# Patient Record
Sex: Female | Born: 1975 | Race: Asian | Hispanic: No | Marital: Married | Smoking: Never smoker
Health system: Southern US, Community
[De-identification: ages and names within clinical notes are randomized; demographics above are authoritative.]

## PROBLEM LIST (undated history)

## (undated) DIAGNOSIS — N83209 Unspecified ovarian cyst, unspecified side: Secondary | ICD-10-CM

## (undated) DIAGNOSIS — R51 Headache: Secondary | ICD-10-CM

## (undated) DIAGNOSIS — F329 Major depressive disorder, single episode, unspecified: Secondary | ICD-10-CM

## (undated) DIAGNOSIS — F32A Depression, unspecified: Secondary | ICD-10-CM

## (undated) DIAGNOSIS — K5900 Constipation, unspecified: Secondary | ICD-10-CM

## (undated) DIAGNOSIS — R87619 Unspecified abnormal cytological findings in specimens from cervix uteri: Secondary | ICD-10-CM

## (undated) DIAGNOSIS — F419 Anxiety disorder, unspecified: Secondary | ICD-10-CM

## (undated) DIAGNOSIS — R519 Headache, unspecified: Secondary | ICD-10-CM

## (undated) DIAGNOSIS — K219 Gastro-esophageal reflux disease without esophagitis: Secondary | ICD-10-CM

## (undated) DIAGNOSIS — T7840XA Allergy, unspecified, initial encounter: Secondary | ICD-10-CM

## (undated) HISTORY — DX: Unspecified ovarian cyst, unspecified side: N83.209

## (undated) HISTORY — PX: WISDOM TOOTH EXTRACTION: SHX21

## (undated) HISTORY — PX: SHOULDER SURGERY: SHX246

## (undated) HISTORY — DX: Unspecified abnormal cytological findings in specimens from cervix uteri: R87.619

## (undated) HISTORY — DX: Allergy, unspecified, initial encounter: T78.40XA

## (undated) HISTORY — PX: COLPOSCOPY: SHX161

---

## 2014-02-15 ENCOUNTER — Encounter: Payer: Self-pay | Admitting: Obstetrics & Gynecology

## 2014-02-15 ENCOUNTER — Ambulatory Visit (INDEPENDENT_AMBULATORY_CARE_PROVIDER_SITE_OTHER): Payer: BC Managed Care – PPO | Admitting: Obstetrics & Gynecology

## 2014-02-15 VITALS — BP 145/96 | HR 64 | Resp 16 | Ht 65.0 in | Wt 128.0 lb

## 2014-02-15 DIAGNOSIS — Z1151 Encounter for screening for human papillomavirus (HPV): Secondary | ICD-10-CM

## 2014-02-15 DIAGNOSIS — Z01419 Encounter for gynecological examination (general) (routine) without abnormal findings: Secondary | ICD-10-CM | POA: Diagnosis not present

## 2014-02-15 DIAGNOSIS — Z124 Encounter for screening for malignant neoplasm of cervix: Secondary | ICD-10-CM | POA: Diagnosis not present

## 2014-02-15 DIAGNOSIS — N8320 Unspecified ovarian cysts: Secondary | ICD-10-CM

## 2014-02-15 DIAGNOSIS — Z30011 Encounter for initial prescription of contraceptive pills: Secondary | ICD-10-CM | POA: Diagnosis not present

## 2014-02-15 DIAGNOSIS — N83202 Unspecified ovarian cyst, left side: Secondary | ICD-10-CM | POA: Insufficient documentation

## 2014-02-15 MED ORDER — NORETHIN-ETH ESTRAD-FE BIPHAS 1 MG-10 MCG / 10 MCG PO TABS: 1.0000 | ORAL_TABLET | Freq: Every day | ORAL | Status: DC

## 2014-02-15 NOTE — Progress Notes (Signed)
Patient ID: Janet Gibson, female   DOB: [birth year], 38 y.o.   MRN: [id number]  Chief Complaint  Patient presents with  . Gynecologic Exam   HPI Janet Gibson is a 38 y.o. female.  G0P0000 Patient's last menstrual period was 02/02/2014 (approximate). On progestin only pill because her mother had a stroke age 20-70, has no clotting d/o HPI  Past Medical History  Diagnosis Date  . Abnormal Pap smear of cervix   . Ovarian cyst     Past Surgical History  Procedure Laterality Date  . Colposcopy     LEEP 2006 Family History  Problem Relation Age of Onset  . Heart disease Mother   . Heart disease Father   . Hypertension Mother   . Colon cancer Paternal Grandfather     Social History History  Substance Use Topics  . Smoking status: Never Smoker   . Smokeless tobacco: Never Used  . Alcohol Use: 1.8 oz/week    3 Not specified per week    No Known Allergies  Current Outpatient Prescriptions  Medication Sig Dispense Refill  . norethindrone (MICRONOR,CAMILA,ERRIN) 0.35 MG tablet      No current facility-administered medications for this visit.    Review of Systems Review of Systems  Constitutional: Negative.   Respiratory: Negative.   Gastrointestinal: Positive for constipation.  Genitourinary: Positive for menstrual problem (breakthrough bleeding) and pelvic pain (occasional and mild). Negative for vaginal bleeding and vaginal discharge.    Blood pressure 145/96, pulse 64, resp. rate 16, height 5' 5"  (1.651 m), weight 128 lb (58.06 kg), last menstrual period 02/02/2014.  Physical Exam Physical Exam  Constitutional: She is oriented to person, place, and time. She appears well-developed and well-nourished. No distress.  HENT:  Head: Normocephalic and atraumatic.  Neck: Normal range of motion. No thyromegaly present.  Cardiovascular: Normal rate, regular rhythm and normal heart sounds.   Pulmonary/Chest: Effort normal and breath sounds normal. No respiratory  distress.  Abdominal: Soft. She exhibits no distension. There is no tenderness.  Genitourinary: Vagina normal and uterus normal. No vaginal discharge found.  Pelvic exam: normal external genitalia, vulva, vagina, cervix, uterus and adnexa.   Neurological: She is alert and oriented to person, place, and time.  Skin: Skin is warm and dry.  Psychiatric: She has a normal mood and affect. Her behavior is normal.    Data Reviewed [place] report  x2 from [facility], 3.2 cm left ovarian complex cyst, last done in July Assessment    Left ovarian cyst for f/u scan . BTB on POP   Plan    F/u pap. LoLo estrin, repeat TV US        Janet Gibson 02/15/2014, 9:39 AM

## 2014-02-15 NOTE — Patient Instructions (Signed)
Abnormal Pap Test Information During a Pap test, the cells on the surface of your cervix are checked to see if they look normal, abnormal, or if they show signs of having been altered by a certain type of virus called human papillomavirus, or HPV. Cervical cells that have been affected by HPV are called dysplasia. Dysplasia is not cancer, but describes abnormal cells found on the surface of the cervix. Depending on the degree of dysplasia, some of the cells may be considered pre-cancerous and may turn into cancer over time if follow up with a caregiver is delayed.  WHAT DOES AN ABNORMAL PAP TEST MEAN? Having an abnormal pap test does not mean that you have cancer. However, certain types of abnormal pap tests can be a sign that a person is at a higher risk of developing cancer. Your caregiver will want to do other tests to find out more about the abnormal cells. Your abnormal Pap test results could show:   Small and uncertain changes that should be carefully watched.   Cervical dysplasia that has caused mild changes and can be followed over time.  Cervical dysplasia that is more severe and needs to be followed and treated to ensure the problem goes away.  Cancer.  When severe cervical dysplasia is found and treated early, it rarely will grow into cancer.  WHAT WILL BE DONE ABOUT MY ABNORMAL PAP TEST?  A colposcopy may be needed. This is a procedure where your cervix is examined using light and magnification.  A small tissue sample of your cervix (biopsy) may need to be removed and then examined. This is often performed if there are areas that appear infected.  A sample of cells from the cervical canal may be removed with either a small brush or scraping instrument (curette). Based on the results of the procedures above, some caregivers may recommend either cryotherapy of the cervix or a surgical LEEP where a portion of the cervix is removed. LEEP is short for "loop electrical excisional  procedure." Rarely, a caregiver may recommend a cone biopsy.This is a procedure where a small, cone-shaped sample of your cervix is taken out. The part that is taken out is the area where the abnormal cells are.  WHAT IF I HAVE A DYSPLASIA OR A CANCER? You may be referred to a specialist. Radiation may also be a treatment for more advanced cancer. Having a hysterectomy is the last treatment option for dysplasia, but it is a more common treatment for someone with cancer. All treatment options will be discussed with you by your caregiver. WHAT SHOULD YOU DO AFTER BEING TREATED? If you have had an abnormal pap test, you should continue to have regular pap tests and check-ups as directed by your caregiver. Your cervical problem will be carefully watched so it does not get worse. Also, your caregiver can watch for, and treat, any new problems that may come up. Document Released: 07/18/2010 Document Revised: 07/28/2012 Document Reviewed: 03/29/2011 [facility] Patient Information 2015 [place], [place]. This information is not intended to replace advice given to you by your health care provider. Make sure you discuss any questions you have with your health care provider.

## 2014-02-16 ENCOUNTER — Encounter: Payer: Self-pay | Admitting: *Deleted

## 2014-02-17 LAB — CYTOLOGY - PAP

## 2014-02-23 ENCOUNTER — Ambulatory Visit (HOSPITAL_COMMUNITY)
Admission: RE | Admit: 2014-02-23 | Discharge: 2014-02-23 | Disposition: A | Discharge status: Home / Self Care | Payer: BC Managed Care – PPO | Source: Ambulatory Visit | Attending: Obstetrics & Gynecology | Admitting: Obstetrics & Gynecology

## 2014-02-23 DIAGNOSIS — N949 Unspecified condition associated with female genital organs and menstrual cycle: Secondary | ICD-10-CM | POA: Insufficient documentation

## 2014-02-23 DIAGNOSIS — Q505 Embryonic cyst of broad ligament: Secondary | ICD-10-CM | POA: Insufficient documentation

## 2014-02-23 DIAGNOSIS — N9489 Other specified conditions associated with female genital organs and menstrual cycle: Secondary | ICD-10-CM | POA: Diagnosis not present

## 2014-02-23 DIAGNOSIS — D252 Subserosal leiomyoma of uterus: Secondary | ICD-10-CM | POA: Diagnosis not present

## 2014-02-23 DIAGNOSIS — N83202 Unspecified ovarian cyst, left side: Secondary | ICD-10-CM

## 2014-02-23 DIAGNOSIS — Z01419 Encounter for gynecological examination (general) (routine) without abnormal findings: Secondary | ICD-10-CM

## 2014-02-23 DIAGNOSIS — D250 Submucous leiomyoma of uterus: Secondary | ICD-10-CM | POA: Diagnosis not present

## 2014-03-15 ENCOUNTER — Encounter: Payer: Self-pay | Admitting: Obstetrics & Gynecology

## 2014-03-15 ENCOUNTER — Ambulatory Visit (INDEPENDENT_AMBULATORY_CARE_PROVIDER_SITE_OTHER): Payer: BC Managed Care – PPO | Admitting: Obstetrics & Gynecology

## 2014-03-15 VITALS — BP 145/91 | HR 62 | Resp 16 | Ht 65.0 in | Wt 128.0 lb

## 2014-03-15 DIAGNOSIS — R1909 Other intra-abdominal and pelvic swelling, mass and lump: Secondary | ICD-10-CM

## 2014-03-15 DIAGNOSIS — N83202 Unspecified ovarian cyst, left side: Secondary | ICD-10-CM

## 2014-03-15 NOTE — Progress Notes (Signed)
Subjective:     Patient ID: Janet Gibson, female   DOB: [birth year], 38 y.o.   MRN: [id number]  HPI hasn't started new Rx OCP waiting for menses. Occasional LLQ pain esp with defecation   Review of Systems  Constitutional: Negative.   Gastrointestinal: Positive for abdominal pain.  Genitourinary: Positive for pelvic pain. Negative for vaginal bleeding and vaginal discharge.       Objective:   Physical Exam  Constitutional: She is oriented to person, place, and time. She appears well-developed. No distress.  Neurological: She is alert and oriented to person, place, and time.  Skin: Skin is warm and dry.  Psychiatric: She has a normal mood and affect.        CLINICAL DATA: Intermittent pelvic pain, left greater than right. Abnormal uterine bleeding.  EXAM: TRANSABDOMINAL AND TRANSVAGINAL ULTRASOUND OF PELVIS  TECHNIQUE: Both transabdominal and transvaginal ultrasound examinations of the pelvis were performed. Transabdominal technique was performed for global imaging of the pelvis including uterus, ovaries, adnexal regions, and pelvic cul-de-sac. It was necessary to proceed with endovaginal exam following the transabdominal exam to visualize the endometrium and left ovary.  COMPARISON: None  FINDINGS: Uterus  Measurements: 9.2 x 4.0 x 4.4 cm. Multiple uterine fibroids, including:  --1.5 x 1.5 x 1.2 cm subserosal anterior fundal fibroid  --1.2 x 1.0 x 0.8 cm submucosal posterior uterine body fibroid  --0.8 x 0.7 x 0.6 cm submucosal left uterine body fibroid  Endometrium  Thickness: 4 mm. No focal abnormality visualized.  Right ovary  Measurements: 3.1 x 1.8 x 1.3 cm. 1.7 x 1.0 x 1.1 cm simple paraovarian cyst/follicle.  Left ovary  Measurements: 6.7 x 3.3 x 3.5 cm. 5.0 x 2.9 x 2.5 cm complex cystic adnexal mass, possibly reflecting complex hemorrhagic cyst or benign ovarian dermoid. Adjacent dilated tubular structure,  possibly reflecting a hydrosalpinx.  Other findings  Moderate pelvic fluid.  IMPRESSION: 5.0 cm complex left adnexal mass, possibly reflecting a hemorrhagic cyst or benign ovarian dermoid. Consider follow-up pelvic ultrasound in 6-12 weeks for initial evaluation.  Uterine fibroids, measuring up to 1.5 cm, as above.   Electronically Signed  By: Julian Hy M.D.  On: 02/23/2014 15:16    Assessment:     Persistent left complex adnexal/ovarian cyst complex. LLQ pain may be GI or Gyn origin, mild     Plan:     Discussed expectant management of this likely benign stable adnexal process vs surgical exploration. She will consider options and return to discuss further in a few weeks  [name], MD 03/15/2014

## 2014-03-15 NOTE — Patient Instructions (Signed)
Ovarian Cyst An ovarian cyst is a fluid-filled sac that forms on an ovary. The ovaries are small organs that produce eggs in women. Various types of cysts can form on the ovaries. Most are not cancerous. Many do not cause problems, and they often go away on their own. Some may cause symptoms and require treatment. Common types of ovarian cysts include:  Functional cysts--These cysts may occur every month during the menstrual cycle. This is normal. The cysts usually go away with the next menstrual cycle if the woman does not get pregnant. Usually, there are no symptoms with a functional cyst.  Endometrioma cysts--These cysts form from the tissue that lines the uterus. They are also called "chocolate cysts" because they become filled with blood that turns brown. This type of cyst can cause pain in the lower abdomen during intercourse and with your menstrual period.  Cystadenoma cysts--This type develops from the cells on the outside of the ovary. These cysts can get very big and cause lower abdomen pain and pain with intercourse. This type of cyst can twist on itself, cut off its blood supply, and cause severe pain. It can also easily rupture and cause a lot of pain.  Dermoid cysts--This type of cyst is sometimes found in both ovaries. These cysts may contain different kinds of body tissue, such as skin, teeth, hair, or cartilage. They usually do not cause symptoms unless they get very big.  Theca lutein cysts--These cysts occur when too much of a certain hormone (human chorionic gonadotropin) is produced and overstimulates the ovaries to produce an egg. This is most common after procedures used to assist with the conception of a baby (in vitro fertilization). CAUSES   Fertility drugs can cause a condition in which multiple large cysts are formed on the ovaries. This is called ovarian hyperstimulation syndrome.  A condition called polycystic ovary syndrome can cause hormonal imbalances that can lead to  nonfunctional ovarian cysts. SIGNS AND SYMPTOMS  Many ovarian cysts do not cause symptoms. If symptoms are present, they may include:  Pelvic pain or pressure.  Pain in the lower abdomen.  Pain during sexual intercourse.  Increasing girth (swelling) of the abdomen.  Abnormal menstrual periods.  Increasing pain with menstrual periods.  Stopping having menstrual periods without being pregnant. DIAGNOSIS  These cysts are commonly found during a routine or annual pelvic exam. Tests may be ordered to find out more about the cyst. These tests may include:  Ultrasound.  X-ray of the pelvis.  CT scan.  MRI.  Blood tests. TREATMENT  Many ovarian cysts go away on their own without treatment. Your health care provider may want to check your cyst regularly for 2-3 months to see if it changes. For women in menopause, it is particularly important to monitor a cyst closely because of the higher rate of ovarian cancer in menopausal women. When treatment is needed, it may include any of the following:  A procedure to drain the cyst (aspiration). This may be done using a long needle and ultrasound. It can also be done through a laparoscopic procedure. This involves using a thin, lighted tube with a tiny camera on the end (laparoscope) inserted through a small incision.  Surgery to remove the whole cyst. This may be done using laparoscopic surgery or an open surgery involving a larger incision in the lower abdomen.  Hormone treatment or birth control pills. These methods are sometimes used to help dissolve a cyst. HOME CARE INSTRUCTIONS   Only take over-the-counter  or prescription medicines as directed by your health care provider.  Follow up with your health care provider as directed.  Get regular pelvic exams and Pap tests. SEEK MEDICAL CARE IF:   Your periods are late, irregular, or painful, or they stop.  Your pelvic pain or abdominal pain does not go away.  Your abdomen becomes  larger or swollen.  You have pressure on your bladder or trouble emptying your bladder completely.  You have pain during sexual intercourse.  You have feelings of fullness, pressure, or discomfort in your stomach.  You lose weight for no apparent reason.  You feel generally ill.  You become constipated.  You lose your appetite.  You develop acne.  You have an increase in body and facial hair.  You are gaining weight, without changing your exercise and eating habits.  You think you are pregnant. SEEK IMMEDIATE MEDICAL CARE IF:   You have increasing abdominal pain.  You feel sick to your stomach (nauseous), and you throw up (vomit).  You develop a fever that comes on suddenly.  You have abdominal pain during a bowel movement.  Your menstrual periods become heavier than usual. MAKE SURE YOU:  Understand these instructions.  Will watch your condition.  Will get help right away if you are not doing well or get worse. Document Released: 04/02/2005 Document Revised: 04/07/2013 Document Reviewed: 12/08/2012 [facility] Patient Information 2015 [place], [place]. This information is not intended to replace advice given to you by your health care provider. Make sure you discuss any questions you have with your health care provider.

## 2014-05-03 ENCOUNTER — Encounter: Payer: Self-pay | Admitting: Obstetrics & Gynecology

## 2014-05-03 ENCOUNTER — Ambulatory Visit (INDEPENDENT_AMBULATORY_CARE_PROVIDER_SITE_OTHER): Payer: BLUE CROSS/BLUE SHIELD | Admitting: Obstetrics & Gynecology

## 2014-05-03 VITALS — BP 137/92 | HR 71 | Resp 16 | Ht 65.0 in | Wt 131.0 lb

## 2014-05-03 DIAGNOSIS — N83202 Unspecified ovarian cyst, left side: Secondary | ICD-10-CM

## 2014-05-03 DIAGNOSIS — N8320 Unspecified ovarian cysts: Secondary | ICD-10-CM

## 2014-05-03 NOTE — Progress Notes (Signed)
Patient ID: Janet Gibson, female   DOB: [birth year], 39 y.o.   MRN: [id number]  Chief Complaint  Patient presents with  . Follow-up    Ovarian cyst    HPI Janet Gibson is a 39 y.o. female.  Still notes occasional LLQ pain with BM and with menses, sharp. No dysuria. Just began to take her OCP consistently.Patient's last menstrual period was 04/25/2014 (approximate). G0P0000   HPI  Past Medical History  Diagnosis Date  . Abnormal Pap smear of cervix   . Ovarian cyst     Past Surgical History  Procedure Laterality Date  . Colposcopy      Family History  Problem Relation Age of Onset  . Heart disease Mother   . Heart disease Father   . Hypertension Mother   . Colon cancer Paternal Grandfather     Social History History  Substance Use Topics  . Smoking status: Never Smoker   . Smokeless tobacco: Never Used  . Alcohol Use: 1.8 oz/week    3 Not specified per week    No Known Allergies  Current Outpatient Prescriptions  Medication Sig Dispense Refill  . Norethindrone-Ethinyl Estradiol-Fe Biphas (LO LOESTRIN FE) 1 MG-10 MCG / 10 MCG tablet Take 1 tablet by mouth daily. 1 Package 11   No current facility-administered medications for this visit.    Review of Systems Review of Systems  Constitutional: Negative.   Gastrointestinal: Positive for abdominal pain. Negative for nausea, constipation and blood in stool.  Genitourinary: Positive for pelvic pain. Negative for dysuria, vaginal bleeding, vaginal discharge and menstrual problem.    Blood pressure 137/92, pulse 71, resp. rate 16, height 5' 5"  (1.651 m), weight 59.421 kg (131 lb), last menstrual period 04/25/2014.  Physical Exam Physical Exam  Constitutional: She is oriented to person, place, and time. She appears well-developed. No distress.  Pulmonary/Chest: Effort normal. No respiratory distress.  Neurological: She is alert and oriented to person, place, and time.  Psychiatric: She has a normal mood  and affect. Her behavior is normal.    Data Reviewed CLINICAL DATA: Intermittent pelvic pain, left greater than right. Abnormal uterine bleeding.  EXAM: TRANSABDOMINAL AND TRANSVAGINAL ULTRASOUND OF PELVIS  TECHNIQUE: Both transabdominal and transvaginal ultrasound examinations of the pelvis were performed. Transabdominal technique was performed for global imaging of the pelvis including uterus, ovaries, adnexal regions, and pelvic cul-de-sac. It was necessary to proceed with endovaginal exam following the transabdominal exam to visualize the endometrium and left ovary.  COMPARISON: None  FINDINGS: Uterus  Measurements: 9.2 x 4.0 x 4.4 cm. Multiple uterine fibroids, including:  --1.5 x 1.5 x 1.2 cm subserosal anterior fundal fibroid  --1.2 x 1.0 x 0.8 cm submucosal posterior uterine body fibroid  --0.8 x 0.7 x 0.6 cm submucosal left uterine body fibroid  Endometrium  Thickness: 4 mm. No focal abnormality visualized.  Right ovary  Measurements: 3.1 x 1.8 x 1.3 cm. 1.7 x 1.0 x 1.1 cm simple paraovarian cyst/follicle.  Left ovary  Measurements: 6.7 x 3.3 x 3.5 cm. 5.0 x 2.9 x 2.5 cm complex cystic adnexal mass, possibly reflecting complex hemorrhagic cyst or benign ovarian dermoid. Adjacent dilated tubular structure, possibly reflecting a hydrosalpinx.  Other findings  Moderate pelvic fluid.  IMPRESSION: 5.0 cm complex left adnexal mass, possibly reflecting a hemorrhagic cyst or benign ovarian dermoid. Consider follow-up pelvic ultrasound in 6-12 weeks for initial evaluation.  Uterine fibroids, measuring up to 1.5 cm, as above.   Electronically Signed  By: Janet Gibson M.D.  On: 02/23/2014  15:16        Assessment    Pelvic pain left ovarian cyst and possible hydrosalpinx     Plan    Requests diagnostic laparoscopy which I will schedule as OP. The procedure and risks were discussed including continued pain and  failure to diagnose her condition.  [name], MD 05/03/2014         Eliyohu Class 05/03/2014, 1:33 PM

## 2014-05-03 NOTE — Patient Instructions (Signed)

## 2014-05-05 ENCOUNTER — Encounter (HOSPITAL_COMMUNITY): Payer: Self-pay

## 2014-05-06 ENCOUNTER — Ambulatory Visit (HOSPITAL_COMMUNITY)
Admission: RE | Admit: 2014-05-06 | Discharge: 2014-05-06 | Disposition: A | Discharge status: Home / Self Care | Payer: BLUE CROSS/BLUE SHIELD | Source: Ambulatory Visit | Attending: Obstetrics & Gynecology | Admitting: Obstetrics & Gynecology

## 2014-05-06 DIAGNOSIS — N8320 Unspecified ovarian cysts: Secondary | ICD-10-CM | POA: Insufficient documentation

## 2014-05-06 DIAGNOSIS — N83202 Unspecified ovarian cyst, left side: Secondary | ICD-10-CM

## 2014-05-10 ENCOUNTER — Other Ambulatory Visit: Payer: Self-pay | Admitting: Obstetrics & Gynecology

## 2014-05-10 ENCOUNTER — Telehealth: Payer: Self-pay | Admitting: *Deleted

## 2014-05-10 NOTE — Telephone Encounter (Signed)
-----   Message from [name], MD sent at 05/06/2014  8:07 PM EST ----- Let ovarian cyst greatly reduced in size

## 2014-05-10 NOTE — Telephone Encounter (Signed)
Called pt to adv ovarian cyst reduced in size - LMOM for pt to rtn if needed.

## 2014-05-12 ENCOUNTER — Encounter (HOSPITAL_COMMUNITY)
Admission: RE | Disposition: A | Discharge status: Home / Self Care | Payer: Self-pay | Source: Ambulatory Visit | Attending: Obstetrics & Gynecology

## 2014-05-12 ENCOUNTER — Ambulatory Visit (HOSPITAL_COMMUNITY): Payer: BLUE CROSS/BLUE SHIELD | Admitting: Anesthesiology

## 2014-05-12 ENCOUNTER — Encounter (HOSPITAL_COMMUNITY): Payer: Self-pay | Admitting: *Deleted

## 2014-05-12 ENCOUNTER — Ambulatory Visit (HOSPITAL_COMMUNITY)
Admission: RE | Admit: 2014-05-12 | Discharge: 2014-05-12 | Disposition: A | Discharge status: Home / Self Care | Payer: BLUE CROSS/BLUE SHIELD | Source: Ambulatory Visit | Attending: Obstetrics & Gynecology | Admitting: Obstetrics & Gynecology

## 2014-05-12 DIAGNOSIS — Z8659 Personal history of other mental and behavioral disorders: Secondary | ICD-10-CM | POA: Diagnosis not present

## 2014-05-12 DIAGNOSIS — Z8742 Personal history of other diseases of the female genital tract: Secondary | ICD-10-CM | POA: Diagnosis not present

## 2014-05-12 DIAGNOSIS — N736 Female pelvic peritoneal adhesions (postinfective): Secondary | ICD-10-CM | POA: Diagnosis not present

## 2014-05-12 DIAGNOSIS — Z8719 Personal history of other diseases of the digestive system: Secondary | ICD-10-CM | POA: Insufficient documentation

## 2014-05-12 DIAGNOSIS — Z79899 Other long term (current) drug therapy: Secondary | ICD-10-CM | POA: Diagnosis not present

## 2014-05-12 DIAGNOSIS — R339 Retention of urine, unspecified: Secondary | ICD-10-CM | POA: Diagnosis not present

## 2014-05-12 DIAGNOSIS — N838 Other noninflammatory disorders of ovary, fallopian tube and broad ligament: Secondary | ICD-10-CM | POA: Diagnosis not present

## 2014-05-12 DIAGNOSIS — D259 Leiomyoma of uterus, unspecified: Secondary | ICD-10-CM | POA: Diagnosis not present

## 2014-05-12 HISTORY — DX: Headache: R51

## 2014-05-12 HISTORY — DX: Major depressive disorder, single episode, unspecified: F32.9

## 2014-05-12 HISTORY — PX: LAPAROSCOPY: SHX197

## 2014-05-12 HISTORY — DX: Headache, unspecified: R51.9

## 2014-05-12 HISTORY — DX: Gastro-esophageal reflux disease without esophagitis: K21.9

## 2014-05-12 HISTORY — DX: Anxiety disorder, unspecified: F41.9

## 2014-05-12 HISTORY — DX: Constipation, unspecified: K59.00

## 2014-05-12 HISTORY — DX: Depression, unspecified: F32.A

## 2014-05-12 LAB — CBC
HEMATOCRIT: 40.8 % (ref 36.0–46.0)
HEMOGLOBIN: 13.7 g/dL (ref 12.0–15.0)
MCH: 32.2 pg (ref 26.0–34.0)
MCHC: 33.6 g/dL (ref 30.0–36.0)
MCV: 95.8 fL (ref 78.0–100.0)
Platelets: 258 10*3/uL (ref 150–400)
RBC: 4.26 MIL/uL (ref 3.87–5.11)
RDW: 12.3 % (ref 11.5–15.5)
WBC: 5.2 10*3/uL (ref 4.0–10.5)

## 2014-05-12 LAB — PREGNANCY, URINE: Preg Test, Ur: NEGATIVE

## 2014-05-12 SURGERY — LAPAROSCOPY, DIAGNOSTIC
Anesthesia: General | Site: Abdomen

## 2014-05-12 MED ORDER — MEPERIDINE HCL 25 MG/ML IJ SOLN: 6.2500 mg | INTRAMUSCULAR | Status: DC | PRN

## 2014-05-12 MED ORDER — SCOPOLAMINE 1 MG/3DAYS TD PT72
1.0000 | MEDICATED_PATCH | Freq: Once | TRANSDERMAL | Status: DC
  Administered 2014-05-12: 1.5 mg via TRANSDERMAL

## 2014-05-12 MED ORDER — DEXAMETHASONE SODIUM PHOSPHATE 10 MG/ML IJ SOLN
INTRAMUSCULAR | Status: AC
  Filled 2014-05-12: qty 1

## 2014-05-12 MED ORDER — NEOSTIGMINE METHYLSULFATE 10 MG/10ML IV SOLN
INTRAVENOUS | Status: AC
  Filled 2014-05-12: qty 1

## 2014-05-12 MED ORDER — PROPOFOL 10 MG/ML IV BOLUS
INTRAVENOUS | Status: AC
  Filled 2014-05-12: qty 20

## 2014-05-12 MED ORDER — DEXAMETHASONE SODIUM PHOSPHATE 10 MG/ML IJ SOLN
INTRAMUSCULAR | Status: DC | PRN
  Administered 2014-05-12: 4 mg via INTRAVENOUS

## 2014-05-12 MED ORDER — METHYLENE BLUE 1 % INJ SOLN
INTRAMUSCULAR | Status: AC
  Filled 2014-05-12: qty 10

## 2014-05-12 MED ORDER — LACTATED RINGERS IV SOLN: INTRAVENOUS | Status: DC

## 2014-05-12 MED ORDER — PROPOFOL 10 MG/ML IV BOLUS
INTRAVENOUS | Status: DC | PRN
  Administered 2014-05-12: 160 mg via INTRAVENOUS

## 2014-05-12 MED ORDER — ACETAMINOPHEN 160 MG/5ML PO SOLN: 325.0000 mg | ORAL | Status: DC | PRN

## 2014-05-12 MED ORDER — PROMETHAZINE HCL 25 MG/ML IJ SOLN: 6.2500 mg | INTRAMUSCULAR | Status: DC | PRN

## 2014-05-12 MED ORDER — LIDOCAINE HCL (CARDIAC) 20 MG/ML IV SOLN
INTRAVENOUS | Status: AC
  Filled 2014-05-12: qty 5

## 2014-05-12 MED ORDER — ONDANSETRON HCL 4 MG/2ML IJ SOLN
INTRAMUSCULAR | Status: AC
  Filled 2014-05-12: qty 2

## 2014-05-12 MED ORDER — NEOSTIGMINE METHYLSULFATE 10 MG/10ML IV SOLN
INTRAVENOUS | Status: DC | PRN
  Administered 2014-05-12: 2 mg via INTRAVENOUS

## 2014-05-12 MED ORDER — KETOROLAC TROMETHAMINE 30 MG/ML IJ SOLN
INTRAMUSCULAR | Status: DC | PRN
  Administered 2014-05-12: 30 mg via INTRAVENOUS

## 2014-05-12 MED ORDER — MIDAZOLAM HCL 2 MG/2ML IJ SOLN
INTRAMUSCULAR | Status: AC
  Filled 2014-05-12: qty 2

## 2014-05-12 MED ORDER — KETOROLAC TROMETHAMINE 30 MG/ML IJ SOLN
INTRAMUSCULAR | Status: AC
  Filled 2014-05-12: qty 1

## 2014-05-12 MED ORDER — BUPIVACAINE HCL (PF) 0.25 % IJ SOLN
INTRAMUSCULAR | Status: AC
Start: 2014-05-12 — End: 2014-05-12
  Filled 2014-05-12: qty 30

## 2014-05-12 MED ORDER — FENTANYL CITRATE 0.05 MG/ML IJ SOLN
INTRAMUSCULAR | Status: DC | PRN
  Administered 2014-05-12 (×3): 50 ug via INTRAVENOUS
  Administered 2014-05-12: 100 ug via INTRAVENOUS

## 2014-05-12 MED ORDER — GLYCOPYRROLATE 0.2 MG/ML IJ SOLN
INTRAMUSCULAR | Status: AC
  Filled 2014-05-12: qty 4

## 2014-05-12 MED ORDER — ROCURONIUM BROMIDE 100 MG/10ML IV SOLN
INTRAVENOUS | Status: DC | PRN
  Administered 2014-05-12: 20 mg via INTRAVENOUS
  Administered 2014-05-12 (×2): 5 mg via INTRAVENOUS

## 2014-05-12 MED ORDER — MIDAZOLAM HCL 2 MG/2ML IJ SOLN: 0.5000 mg | Freq: Once | INTRAMUSCULAR | Status: DC | PRN

## 2014-05-12 MED ORDER — LACTATED RINGERS IR SOLN
Status: DC | PRN
  Administered 2014-05-12: 500 mL

## 2014-05-12 MED ORDER — GLYCOPYRROLATE 0.2 MG/ML IJ SOLN
INTRAMUSCULAR | Status: DC | PRN
  Administered 2014-05-12: 0.1 mg via INTRAVENOUS
  Administered 2014-05-12: 0.4 mg via INTRAVENOUS

## 2014-05-12 MED ORDER — ONDANSETRON HCL 4 MG/2ML IJ SOLN
INTRAMUSCULAR | Status: DC | PRN
  Administered 2014-05-12: 4 mg via INTRAVENOUS

## 2014-05-12 MED ORDER — HYDROMORPHONE HCL 1 MG/ML IJ SOLN: 0.2500 mg | INTRAMUSCULAR | Status: DC | PRN

## 2014-05-12 MED ORDER — FENTANYL CITRATE 0.05 MG/ML IJ SOLN
INTRAMUSCULAR | Status: AC
  Filled 2014-05-12: qty 5

## 2014-05-12 MED ORDER — MIDAZOLAM HCL 2 MG/2ML IJ SOLN
INTRAMUSCULAR | Status: DC | PRN
  Administered 2014-05-12: 2 mg via INTRAVENOUS

## 2014-05-12 MED ORDER — BUPIVACAINE HCL (PF) 0.25 % IJ SOLN
INTRAMUSCULAR | Status: DC | PRN
  Administered 2014-05-12: 14 mL

## 2014-05-12 MED ORDER — KETOROLAC TROMETHAMINE 30 MG/ML IJ SOLN: 30.0000 mg | Freq: Once | INTRAMUSCULAR | Status: DC | PRN

## 2014-05-12 MED ORDER — SCOPOLAMINE 1 MG/3DAYS TD PT72
MEDICATED_PATCH | TRANSDERMAL | Status: DC
Start: 2014-05-12 — End: 2014-05-12
  Filled 2014-05-12: qty 1

## 2014-05-12 MED ORDER — ACETAMINOPHEN 325 MG PO TABS: 325.0000 mg | ORAL_TABLET | ORAL | Status: DC | PRN

## 2014-05-12 MED ORDER — LIDOCAINE HCL (CARDIAC) 20 MG/ML IV SOLN
INTRAVENOUS | Status: DC | PRN
  Administered 2014-05-12: 60 mg via INTRAVENOUS

## 2014-05-12 MED ORDER — OXYCODONE-ACETAMINOPHEN 5-325 MG PO TABS: 1.0000 | ORAL_TABLET | Freq: Four times a day (QID) | ORAL | Status: DC | PRN

## 2014-05-12 MED ORDER — ROCURONIUM BROMIDE 100 MG/10ML IV SOLN
INTRAVENOUS | Status: AC
  Filled 2014-05-12: qty 1

## 2014-05-12 MED ORDER — LACTATED RINGERS IV SOLN
INTRAVENOUS | Status: DC
Start: 2014-05-12 — End: 2014-05-12
  Administered 2014-05-12 (×2): via INTRAVENOUS

## 2014-05-12 SURGICAL SUPPLY — 31 items
BLADE SURG 11 STRL SS (BLADE) ×3 IMPLANT
CABLE HIGH FREQUENCY MONO STRZ (ELECTRODE) ×3 IMPLANT
CATH ROBINSON RED A/P 16FR (CATHETERS) IMPLANT
CHLORAPREP W/TINT 26ML (MISCELLANEOUS) ×3 IMPLANT
CLOSURE WOUND 1/2 X4 (GAUZE/BANDAGES/DRESSINGS)
CLOTH BEACON ORANGE TIMEOUT ST (SAFETY) ×3 IMPLANT
DRSG COVADERM PLUS 2X2 (GAUZE/BANDAGES/DRESSINGS) ×3 IMPLANT
DRSG OPSITE POSTOP 3X4 (GAUZE/BANDAGES/DRESSINGS) ×3 IMPLANT
GLOVE BIO SURGEON STRL SZ 6.5 (GLOVE) ×2 IMPLANT
GLOVE BIO SURGEONS STRL SZ 6.5 (GLOVE) ×1
GLOVE BIOGEL PI IND STRL 7.0 (GLOVE) ×1 IMPLANT
GLOVE BIOGEL PI INDICATOR 7.0 (GLOVE) ×2
GOWN STRL REUS W/TWL LRG LVL3 (GOWN DISPOSABLE) ×6 IMPLANT
LIQUID BAND (GAUZE/BANDAGES/DRESSINGS) ×3 IMPLANT
NEEDLE INSUFFLATION 120MM (ENDOMECHANICALS) ×3 IMPLANT
NS IRRIG 1000ML POUR BTL (IV SOLUTION) ×3 IMPLANT
PACK LAPAROSCOPY BASIN (CUSTOM PROCEDURE TRAY) ×3 IMPLANT
PAD POSITIONER PINK NONSTERILE (MISCELLANEOUS) IMPLANT
PROTECTOR NERVE ULNAR (MISCELLANEOUS) ×3 IMPLANT
SCISSORS LAP 5X35 DISP (ENDOMECHANICALS) ×3 IMPLANT
SET IRRIG TUBING LAPAROSCOPIC (IRRIGATION / IRRIGATOR) IMPLANT
SHEARS HARMONIC ACE PLUS 36CM (ENDOMECHANICALS) IMPLANT
STRIP CLOSURE SKIN 1/2X4 (GAUZE/BANDAGES/DRESSINGS) IMPLANT
SUT VICRYL 0 UR6 27IN ABS (SUTURE) ×3 IMPLANT
SUT VICRYL 4-0 PS2 18IN ABS (SUTURE) ×3 IMPLANT
TOWEL OR 17X24 6PK STRL BLUE (TOWEL DISPOSABLE) ×6 IMPLANT
TRAY FOLEY BAG SILVER LF 16FR (CATHETERS) ×3 IMPLANT
TROCAR XCEL DIL TIP R 11M (ENDOMECHANICALS) ×3 IMPLANT
TROCAR XCEL NON-BLD 5MMX100MML (ENDOMECHANICALS) ×6 IMPLANT
WARMER LAPAROSCOPE (MISCELLANEOUS) ×3 IMPLANT
WATER STERILE IRR 1000ML POUR (IV SOLUTION) ×3 IMPLANT

## 2014-05-12 NOTE — Anesthesia Procedure Notes (Signed)
Procedure Name: Intubation Date/Time: 05/12/2014 1:12 PM Performed by: Flossie Dibble Pre-anesthesia Checklist: Suction available, Emergency Drugs available, Timeout performed, Patient being monitored and Patient identified Oxygen Delivery Method: Circle system utilized Preoxygenation: Pre-oxygenation with 100% oxygen Intubation Type: IV induction Ventilation: Mask ventilation without difficulty Laryngoscope Size: Mac and 3 Grade View: Grade I Tube size: 7.0 mm Number of attempts: 1 Placement Confirmation: ETT inserted through vocal cords under direct vision,  breath sounds checked- equal and bilateral and positive ETCO2 Secured at: 21 cm Tube secured with: Tape Dental Injury: Teeth and Oropharynx as per pre-operative assessment

## 2014-05-12 NOTE — Discharge Instructions (Signed)
Lysis of Adhesions Adhesions are internal scars that may lead to troubling side effects. They are bands of scar tissue that can occur anywhere in the body but are usually formed within the abdomen or pelvis. When adhesions are formed, they can cause problems in the normal function of the organs. This scar tissue can cause abnormal "sticking" together of two surfaces. This kind of scar can also "squeeze" an organ and cause a blockage. Adhesions can develop after surgery and may also form as a result of an inflammation or infection. Depending on where the adhesions are, they may cause:  Pain.  Obstruction of the bowel.  Infertility.  Other complications. Lysis (loosening or setting free) is a surgical procedure done to remove or loosen the scars that cause the problems described above. This can help to reduce or relieve pain and help to restore normal function.  LET YOUR CAREGIVER KNOW ABOUT:   Allergies.  Medicines taken including herbs, eye drops, prescription medications, over-the-counter medications, and creams.  Use of steroids (by mouth or creams).  Use of aspirin or blood-thinning medicines.  History of bleeding or blood problems.  Previous problems with anesthetics or Novocaine.  Possibility of pregnancy, if this applies.  History of blood clots (thrombophlebitis).  Previous surgery.  Previous/existing problems with the heart or lung.  Other health problems, such as diabetes. RISKS AND COMPLICATIONS  Injury to the bowel, bladder, blood vessels, and/or tubes that carry urine to the urinary bladder (ureters).  Bleeding.  Infection.  Recurrence of adhesions.  Abnormal protrusion of an organ through an operative incision (incisional hernia). BEFORE THE PROCEDURE  Your caregiver may:  Discuss the risks and complications of the procedure.  Recommend imaging studies and blood tests.  Advise you regarding medicines you are taking.  Discuss your anesthesia  requirements. PROCEDURE Lysis of adhesions is usually performed by keyhole surgery (laparoscopically). During this procedure, the surgeon will make a few tiny keyhole incisions. He/she will pass a tiny fiberoptic scope through one hole to view the adhesions on a monitor. Using small instruments that are passed through the other holes, the surgery will be performed to remove the adhesions and free the tissues/organs. The instruments are then removed, and the incisions are closed. This procedure may cause minimal scarring. Sometimes, open surgery (laparotomy) may be required where a single larger incision will be made to allow the surgeon a direct access to the area of the adhesions.  You may be given regional or general anesthesia. If the surgery is going to be performed under general anesthesia:  A light meal, such as soup or salad, is recommended on the previous night.  You will probably be asked not to eat or drink anything after midnight on the night before the procedure. AFTER THE PROCEDURE  The surgery may last for 1-3 hours. Depending on the type of surgery, you may have to spend a night or a few days in the hospital. Typically, patients stay in the hospital and can not eat or drink until bowel function returns. Patients may have a tube put in place to decrease problems with the bowels (nasogastric tube). When your system can tolerate food, you can go home. Your caregiver will give you certain medications to control your pain. HOME CARE INSTRUCTIONS   Follow all the instructions given by your surgeon at the time of discharge.  Take all prescribed medications as instructed and go for regular follow-ups.  Keep the dressing clean and dry.  As pain gets less, increase moving and  walking and other regular activities that you do every day.  Avoid lifting heavy objects unless advised otherwise by your caregiver.  At first, you may need to eat only a little at a time. As your appetite improves,  you can slowly go back to eating normally.  If applicable, your caregiver can help you to know when it is okay to start driving and exercising again. SEEK MEDICAL CARE IF:   You develop fever above 100 F (37.8 C).  You develop infection.  Your pain increases.  You notice redness or swelling at the site of the incision.  You have nausea and vomiting that does not go away after a few hours.  You have a cough.  You have bleeding or oozing from the incision site. SEEK IMMEDIATE MEDICAL CARE IF:   You develop fever of 102 F (38.9 C) or above.  You develop severe abdominal, chest pain.  You develop shortness of breath.  You experience nausea and/or vomiting that keeps coming back or is severe. Document Released: 06/29/2008 Document Revised: 06/25/2011 Document Reviewed: 06/29/2008 [facility] Patient Information 2015 [place], [place]. This information is not intended to replace advice given to you by your health care provider. Make sure you discuss any questions you have with your health care provider. DISCHARGE INSTRUCTIONS: Laparoscopy  The following instructions have been prepared to help you care for yourself upon your return home today.  Wound care:  Do not get the incision wet for the first 24 hours. The incision should be kept clean and dry.  The Band-Aids or dressings may be removed the day after surgery.  Should the incision become sore, red, and swollen after the first week, check with your doctor.  Personal hygiene:  Shower the day after your procedure.  Activity and limitations:  Do NOT drive or operate any equipment today.  Do NOT lift anything more than 15 pounds for 2-3 weeks after surgery.  Do NOT rest in bed all day.  Walking is encouraged. Walk each day, starting slowly with 5-minute walks 3 or 4 times a day. Slowly increase the length of your walks.  Walk up and down stairs slowly.  Do NOT do strenuous activities, such as golfing, playing tennis,  bowling, running, biking, weight lifting, gardening, mowing, or vacuuming for 2-4 weeks. Ask your doctor when it is okay to start.  Diet: Eat a light meal as desired this evening. You may resume your usual diet tomorrow.  Return to work: This is dependent on the type of work you do. For the most part you can return to a desk job within a week of surgery. If you are more active at work, please discuss this with your doctor.  What to expect after your surgery: You may have a slight burning sensation when you urinate on the first day. You may have a very small amount of blood in the urine. Expect to have a small amount of vaginal discharge/light bleeding for 1-2 weeks. It is not unusual to have abdominal soreness and bruising for up to 2 weeks. You may be tired and need more rest for about 1 week. You may experience shoulder pain for 24-72 hours. Lying flat in bed may relieve it.  Call your doctor for any of the following:  Develop a fever of 100.4 or greater  Inability to urinate 6 hours after discharge from hospital  Severe pain not relieved by pain medications  Persistent of heavy bleeding at incision site  Redness or swelling around incision site  after a week  Increasing nausea or vomiting  Patient Signature________________________________________ Nurse Signature_________________________________________ Post Anesthesia Home Care Instructions  Activity: Get plenty of rest for the remainder of the day. A responsible adult should stay with you for 24 hours following the procedure.  For the next 24 hours, DO NOT: -Drive a car -Paediatric nurse -Drink alcoholic beverages -Take any medication unless instructed by your physician -Make any legal decisions or sign important papers.  Meals: Start with liquid foods such as gelatin or soup. Progress to regular foods as tolerated. Avoid greasy, spicy, heavy foods. If nausea and/or vomiting occur, drink only clear liquids until the nausea  and/or vomiting subsides. Call your physician if vomiting continues.  Special Instructions/Symptoms: Your throat may feel dry or sore from the anesthesia or the breathing tube placed in your throat during surgery. If this causes discomfort, gargle with warm salt water. The discomfort should disappear within 24 hours.

## 2014-05-12 NOTE — Op Note (Addendum)
Diagnostic Laparoscopy Procedure Note Diagnostic laparoscopy, drainage of bilateral paratubal cysts, lysis of pelvic adhesions, chromopertubation Indications: The patient is a 39 y.o. female with pelvic pain and history of ovarian cysts and possible hydrosalpinx based on ultrasound findings.  Pre-operative Diagnosis: Pelvic pain and possible hydrosalpinx  Post-operative Diagnosis: Pelvic adhesions bilateral paratubal cysts  Surgeon: Makyia Erxleben   Assistants: NoneGeneral endotracheal anesthesia  Anesthesia: General endotracheal anesthesia  ASA Class: 1  Procedure Details  The patient was seen in the Holding Room. The risks, benefits, complications, treatment options, and expected outcomes were discussed with the patient. The possibilities of reaction to medication, pulmonary aspiration, perforation of viscus, bleeding, recurrent infection, the need for additional procedures, failure to diagnose a condition, and creating a complication requiring transfusion or operation were discussed with the patient. The patient concurred with the proposed plan, giving informed consent. The patient was taken to the Operating Room, identified as Janet Gibson and the procedure verified as Diagnostic Laparoscopy. A Time Out was held and the above information confirmed.  After induction of general anesthesia, the patient was placed in modified dorsal lithotomy position where she was prepped, draped, and catheterized in the normal, sterile fashion.  The cervix was visualized and an intrauterine manipulator was placed. A 1.5 cm umbilical incision was then performed. Veress needle was passed and pneumoperitoneum was established. The above findings were noted. The uterus appeared normal. Second punctures were placed in the left lower quadrant and right lower quadrant with 5 mm ports. The tubes were inspected. The left tube had a distal paratubal cyst measuring about 3 centimeters. The right tube had approximately  2 cm paratubal cyst. The ovaries appeared normal. Cul-de-sac was normal. There were adhesions of bowel in the right lower quadrant along the sidewall and going down to the adnexa on the right. Using scissors and cautery these adhesions were lysed. There was good hemostasis. The appendix was visualized and appeared normal. Using the scissors and cautery both paratubal cyst were drained. No specimen was obtained. There is no evidence of endometriosis. Using the catheter that had been placed as the uterine manipulator methylene dye solution was injected and spillage of dye was seen from both fimbria appeared normal both sides. All instruments were removed and CO2 was released from the abdomen.   The incision was closed with fascial closure using 0 Vicryl and subcuticular sutures of 4-0 Vicryl. Liquid band was applied to the incisions. The intrauterine manipulator was then removed.  Instrument, sponge, and needle counts were correct prior to abdominal closure and at the conclusion of the case.   Findings: The anterior cul-de-sac and round ligaments normal The uterus normal The adnexa bilateral paratubal cysts and right lower quadrant adhesions Cul-de-sac normal  Estimated Blood Loss:  Minimal         Drains: None         Total IV Fluids: 1500 mL         Specimens: None              Complications:  None; patient tolerated the procedure well.         Disposition: PACU - hemodynamically stable.         Condition: stable  [name], MD  05/12/2014 2:40 PM

## 2014-05-12 NOTE — H&P (Signed)
Expand All Collapse All   Patient ID: Janet Gibson, female DOB: [birth year], 39 y.o. MRN: [id number]  Chief Complaint  Patient presents with  . Follow-up    Ovarian cyst    HPI Janet Gibson is a 39 y.o. female. Still notes occasional LLQ pain with BM and with menses, sharp. No dysuria. Just began to take her OCP consistently.Patient's last menstrual period was 04/25/2014 (approximate). [id number]  HPI  Past Medical History  Diagnosis Date  . Abnormal Pap smear of cervix   . Ovarian cyst     Past Surgical History  Procedure Laterality Date  . Colposcopy      Family History  Problem Relation Age of Onset  . Heart disease Mother   . Heart disease Father   . Hypertension Mother   . Colon cancer Paternal Grandfather     Social History History  Substance Use Topics  . Smoking status: Never Smoker   . Smokeless tobacco: Never Used  . Alcohol Use: 1.8 oz/week    3 Not specified per week    No Known Allergies  Current Outpatient Prescriptions  Medication Sig Dispense Refill  . Norethindrone-Ethinyl Estradiol-Fe Biphas (LO LOESTRIN FE) 1 MG-10 MCG / 10 MCG tablet Take 1 tablet by mouth daily. 1 Package 11   No current facility-administered medications for this visit.    Review of Systems Review of Systems  Constitutional: Negative.  Gastrointestinal: Positive for abdominal pain. Negative for nausea, constipation and blood in stool.  Genitourinary: Positive for pelvic pain. Negative for dysuria, vaginal bleeding, vaginal discharge and menstrual problem.    Blood pressure 137/92, pulse 71, resp. rate 16, height 5' 5"  (1.651 m), weight 59.421 kg (131 lb), last menstrual period 04/25/2014.  Physical Exam Physical Exam  Constitutional: She is oriented to person, place, and time. She appears well-developed. No distress.  Pulmonary/Chest: Effort normal. No respiratory distress.   Neurological: She is alert and oriented to person, place, and time.  Psychiatric: She has a normal mood and affect. Her behavior is normal.    Data Reviewed CLINICAL DATA: Intermittent pelvic pain, left greater than right. Abnormal uterine bleeding.  EXAM: TRANSABDOMINAL AND TRANSVAGINAL ULTRASOUND OF PELVIS  TECHNIQUE: Both transabdominal and transvaginal ultrasound examinations of the pelvis were performed. Transabdominal technique was performed for global imaging of the pelvis including uterus, ovaries, adnexal regions, and pelvic cul-de-sac. It was necessary to proceed with endovaginal exam following the transabdominal exam to visualize the endometrium and left ovary.  COMPARISON: None  FINDINGS: Uterus  Measurements: 9.2 x 4.0 x 4.4 cm. Multiple uterine fibroids, including:  --1.5 x 1.5 x 1.2 cm subserosal anterior fundal fibroid  --1.2 x 1.0 x 0.8 cm submucosal posterior uterine body fibroid  --0.8 x 0.7 x 0.6 cm submucosal left uterine body fibroid  Endometrium  Thickness: 4 mm. No focal abnormality visualized.  Right ovary  Measurements: 3.1 x 1.8 x 1.3 cm. 1.7 x 1.0 x 1.1 cm simple paraovarian cyst/follicle.  Left ovary  Measurements: 6.7 x 3.3 x 3.5 cm. 5.0 x 2.9 x 2.5 cm complex cystic adnexal mass, possibly reflecting complex hemorrhagic cyst or benign ovarian dermoid. Adjacent dilated tubular structure, possibly reflecting a hydrosalpinx.  Other findings  Moderate pelvic fluid.  IMPRESSION: 5.0 cm complex left adnexal mass, possibly reflecting a hemorrhagic cyst or benign ovarian dermoid. Consider follow-up pelvic ultrasound in 6-12 weeks for initial evaluation.  Uterine fibroids, measuring up to 1.5 cm, as above.   Electronically Signed  By: Julian Hy M.D.  On: 02/23/2014 15:16  Assessment    Pelvic pain left ovarian cyst and possible hydrosalpinx   Left ovarian cyst has significantly  resolved since previous US  Plan    Requests diagnostic laparoscopy which I will schedule as OP. The procedure and risks were discussed including continued pain and failure to diagnose her condition. Anesthesia risk, bleeding, infection, visceral organ damage were also discussed  [name], MD 05/12/2014

## 2014-05-12 NOTE — Anesthesia Preprocedure Evaluation (Signed)
Anesthesia Evaluation  Patient identified by MRN, date of birth, ID band Patient awake    Reviewed: Allergy & Precautions, H&P , Patient's Chart, lab work & pertinent test results, reviewed documented beta blocker date and time   History of Anesthesia Complications Negative for: history of anesthetic complications  Airway Mallampati: II  TM Distance: >3 FB Neck ROM: full    Dental   Pulmonary  breath sounds clear to auscultation        Cardiovascular Exercise Tolerance: Good Rhythm:regular Rate:Normal     Neuro/Psych  Headaches, PSYCHIATRIC DISORDERS Anxiety Depression negative psych ROS   GI/Hepatic GERD-  Controlled,  Endo/Other    Renal/GU      Musculoskeletal   Abdominal   Peds  Hematology   Anesthesia Other Findings   Reproductive/Obstetrics                             Anesthesia Physical Anesthesia Plan  ASA: II  Anesthesia Plan: General ETT   Post-op Pain Management:    Induction:   Airway Management Planned:   Additional Equipment:   Intra-op Plan:   Post-operative Plan:   Informed Consent: I have reviewed the patients History and Physical, chart, labs and discussed the procedure including the risks, benefits and alternatives for the proposed anesthesia with the patient or authorized representative who has indicated his/her understanding and acceptance.   Dental Advisory Given  Plan Discussed with: CRNA, Surgeon and Anesthesiologist  Anesthesia Plan Comments:         Anesthesia Quick Evaluation

## 2014-05-12 NOTE — Anesthesia Postprocedure Evaluation (Signed)
  Anesthesia Post-op Note  Patient: Janet Gibson  Procedure(s) Performed: Procedure(s): LAPAROSCOPY DIAGNOSTIC WITH CHROMO IPERTUBATION WITH BILATERAL PERITUBAL CYST DRAINAGE (N/A)  Patient Location: PACU  Anesthesia Type:General  Level of Consciousness: awake, alert  and oriented  Airway and Oxygen Therapy: Patient Spontanous Breathing  Post-op Pain: none  Post-op Assessment: Post-op Vital signs reviewed, Patient's Cardiovascular Status Stable, Respiratory Function Stable, Patent Airway, No signs of Nausea or vomiting and Pain level controlled  Post-op Vital Signs: Reviewed and stable  Last Vitals:  Filed Vitals:   05/12/14 1545  BP: 123/75  Pulse: 58  Temp: 36.8 C  Resp: 21    Complications: No apparent anesthesia complications

## 2014-05-12 NOTE — Transfer of Care (Signed)
Immediate Anesthesia Transfer of Care Note  Patient: Janet Gibson  Procedure(s) Performed: Procedure(s): LAPAROSCOPY DIAGNOSTIC WITH CHROMO IPERTUBATION WITH BILATERAL PERITUBAL CYST DRAINAGE (N/A)  Patient Location: PACU  Anesthesia Type:General  Level of Consciousness: awake, alert  and oriented  Airway & Oxygen Therapy: Patient Spontanous Breathing and Patient connected to nasal cannula oxygen  Post-op Assessment: Report given to PACU RN and Post -op Vital signs reviewed and stable  Post vital signs: Reviewed and stable  Complications: No apparent anesthesia complications

## 2014-05-13 ENCOUNTER — Emergency Department (HOSPITAL_COMMUNITY)
Admission: EM | Admit: 2014-05-13 | Discharge: 2014-05-13 | Disposition: A | Discharge status: Home / Self Care | Payer: BLUE CROSS/BLUE SHIELD | Source: Home / Self Care | Attending: Emergency Medicine | Admitting: Emergency Medicine

## 2014-05-13 ENCOUNTER — Encounter: Payer: Self-pay | Admitting: Family Medicine

## 2014-05-13 ENCOUNTER — Encounter (HOSPITAL_COMMUNITY): Payer: Self-pay | Admitting: Emergency Medicine

## 2014-05-13 ENCOUNTER — Emergency Department (HOSPITAL_COMMUNITY)
Admission: EM | Admit: 2014-05-13 | Discharge: 2014-05-13 | Disposition: A | Discharge status: Home / Self Care | Payer: BLUE CROSS/BLUE SHIELD | Attending: Emergency Medicine | Admitting: Emergency Medicine

## 2014-05-13 DIAGNOSIS — Z79899 Other long term (current) drug therapy: Secondary | ICD-10-CM

## 2014-05-13 DIAGNOSIS — Z466 Encounter for fitting and adjustment of urinary device: Secondary | ICD-10-CM

## 2014-05-13 DIAGNOSIS — Z8669 Personal history of other diseases of the nervous system and sense organs: Secondary | ICD-10-CM

## 2014-05-13 DIAGNOSIS — R339 Retention of urine, unspecified: Secondary | ICD-10-CM

## 2014-05-13 DIAGNOSIS — Z8719 Personal history of other diseases of the digestive system: Secondary | ICD-10-CM | POA: Insufficient documentation

## 2014-05-13 DIAGNOSIS — Z9889 Other specified postprocedural states: Secondary | ICD-10-CM | POA: Insufficient documentation

## 2014-05-13 DIAGNOSIS — Z87448 Personal history of other diseases of urinary system: Secondary | ICD-10-CM

## 2014-05-13 LAB — URINE MICROSCOPIC-ADD ON

## 2014-05-13 LAB — URINALYSIS, ROUTINE W REFLEX MICROSCOPIC
Bilirubin Urine: NEGATIVE
Glucose, UA: NEGATIVE mg/dL
Leukocytes, UA: NEGATIVE
Nitrite: NEGATIVE
PH: 6.5 (ref 5.0–8.0)
PROTEIN: NEGATIVE mg/dL
Specific Gravity, Urine: 1.01 (ref 1.005–1.030)
UROBILINOGEN UA: 0.2 mg/dL (ref 0.0–1.0)

## 2014-05-13 MED ORDER — IBUPROFEN 800 MG PO TABS
800.0000 mg | ORAL_TABLET | Freq: Once | ORAL | Status: AC
  Administered 2014-05-13: 800 mg via ORAL
  Filled 2014-05-13: qty 1

## 2014-05-13 NOTE — Discharge Instructions (Signed)
Follow-up in 24 hours with your primary Dr. to have the catheter removed.  Return to the emergency department if unable to make this appointment, or you develop any new and concerning symptoms.   Acute Urinary Retention Acute urinary retention is the temporary inability to urinate. This is an uncommon problem in women. It can be caused by:  Infection.  A side effect of a medicine.  A problem in a nearby organ that presses or squeezes on the bladder or the urethra (the tube that drains the bladder).  Psychological problems.   Surgery on your bladder, urethra, or pelvic organs that causes obstruction to the outflow of urine from your bladder. HOME CARE INSTRUCTIONS  If you are sent home with a Foley catheter and a drainage system, you will need to discuss the best course of action with your health care provider. While the catheter is in, maintain a good intake of fluids. Keep the drainage bag emptied and lower than your catheter. This is so that contaminated urine will not flow back into your bladder, which could lead to a urinary tract infection. There are two main types of drainage bags. One is a large bag that usually is used at night. It has a good capacity that will allow you to sleep through the night without having to empty it. The second type is called a leg bag. It has a smaller capacity so it needs to be emptied more frequently. However, the main advantage is that it can be attached by a leg strap and goes underneath your clothing, allowing you the freedom to move about or leave your home. Only take over-the-counter or prescription medicines for pain, discomfort, or fever as directed by your health care provider.  SEEK MEDICAL CARE IF:  You develop a low-grade fever.  You experience spasms or leakage of urine with the spasms. SEEK IMMEDIATE MEDICAL CARE IF:   You develop chills or fever.  Your catheter stops draining urine.  Your catheter falls out.  You start to develop  increased bleeding that does not respond to rest and increased fluid intake. MAKE SURE YOU:  Understand these instructions.  Will watch your condition.  Will get help right away if you are not doing well or get worse. Document Released: 04/01/2006 Document Revised: 01/21/2013 Document Reviewed: 09/11/2012 [facility] Patient Information 2015 [place], [place]. This information is not intended to replace advice given to you by your health care provider. Make sure you discuss any questions you have with your health care provider.

## 2014-05-13 NOTE — ED Notes (Signed)
Pt. In bathroom attempting to urinate.

## 2014-05-13 NOTE — ED Notes (Addendum)
Pt. Reports having laparoscopic procedure done at noon today and had scar tissue removed from intestines. Pt. Reports need to void at 2030 but "was only able to get a few drops out" Pt. C/o lower abdominal pain.

## 2014-05-13 NOTE — ED Notes (Signed)
Pt. Catheter connected to leg bag.

## 2014-05-13 NOTE — ED Provider Notes (Signed)
CSN: [id number]     Arrival date & time 05/12/14  2346 History  This chart was scribed for Janet Speak, MD by Edison Simon, ED Scribe. This patient was seen in room APA06/APA06 and the patient's care was started at 12:14 AM.    Chief Complaint  Patient presents with  . Urinary Retention   The history is provided by the patient. No language interpreter was used.    HPI Comments: Janet Gibson is a 39 y.o. female who presents to the Emergency Department complaining of urinary retention status post surgery at [facility] from which she was released at 30 today. She states she had a diagnostic laparoscopic procedure for swollen fallopian tube and ovarian cyst and had some scar tissue removed from intestine. She states she had a catheter in during the surgery. She states she felt like she had to urinate at 2030 and was not able to. She states she feels like she has to urinate but it feels like the urine  "travels down there then it dribbles like 2 drops then seizes up." She denies similar symptoms prior to the procedure. She states she is healthy otherwise.    Past Medical History  Diagnosis Date  . Abnormal Pap smear of cervix   . Ovarian cyst   . Anxiety   . Depression   . GERD (gastroesophageal reflux disease)   . Headache     migraines  . Constipation    Past Surgical History  Procedure Laterality Date  . Colposcopy    . Wisdom tooth extraction     Family History  Problem Relation Age of Onset  . Heart disease Mother   . Heart disease Father   . Hypertension Mother   . Colon cancer Paternal Grandfather    History  Substance Use Topics  . Smoking status: Never Smoker   . Smokeless tobacco: Never Used  . Alcohol Use: 1.8 oz/week    3 Not specified per week   OB History    Gravida Para Term Preterm AB TAB SAB Ectopic Multiple Living   0 0 0 0 0 0 0 0 0 0      Review of Systems A complete 10 system review of systems was obtained and all systems are negative  except as noted in the HPI and PMH.     Allergies  Review of patient's allergies indicates no known allergies.  Home Medications   Prior to Admission medications   Medication Sig Start Date End Date Taking? Authorizing Provider  cetirizine (ZYRTEC) 10 MG tablet Take 10 mg by mouth daily.    Historical Provider, MD  docusate sodium (COLACE) 100 MG capsule Take 100 mg by mouth as needed for mild constipation.    Historical Provider, MD  ibuprofen (ADVIL,MOTRIN) 200 MG tablet Take 200 mg by mouth as needed for moderate pain.    Historical Provider, MD  multivitamin-iron-minerals-folic acid (CENTRUM) chewable tablet Chew 1 tablet by mouth daily.    Historical Provider, MD  Norethindrone-Ethinyl Estradiol-Fe Biphas (LO LOESTRIN FE) 1 MG-10 MCG / 10 MCG tablet Take 1 tablet by mouth daily. 02/15/14   [name], MD  oxyCODONE-acetaminophen (PERCOCET/ROXICET) 5-325 MG per tablet Take 1-2 tablets by mouth every 6 (six) hours as needed. 05/12/14   [name], MD   BP 138/92 mmHg  Pulse 51  Temp(Src) 98.3 F (36.8 C) (Oral)  Resp 15  Ht 5' 5"  (1.651 m)  Wt 126 lb (57.153 kg)  BMI 20.97 kg/m2  SpO2 100%  LMP 04/25/2014 (Approximate) Physical Exam  Constitutional: She is oriented to person, place, and time. She appears well-developed and well-nourished.  HENT:  Head: Normocephalic and atraumatic.  Eyes: Conjunctivae and EOM are normal.  Neck: Normal range of motion. Neck supple.  Cardiovascular: Normal rate, regular rhythm and normal heart sounds.   No murmur heard. Pulmonary/Chest: Effort normal and breath sounds normal. No respiratory distress. She has no wheezes. She has no rales.  Abdominal: Soft. Bowel sounds are normal. She exhibits no distension. There is tenderness (mild) in the suprapubic area. There is no rebound and no guarding.  Musculoskeletal: Normal range of motion.  Neurological: She is alert and oriented to person, place, and time.  Skin: Skin is warm and dry.   Psychiatric: She has a normal mood and affect.  Nursing note and vitals reviewed.   ED Course  Procedures (including critical care time)  DIAGNOSTIC STUDIES: Oxygen Saturation is 100% on room air, normal by my interpretation.    COORDINATION OF CARE: 12:19 AM Discussed treatment plan with patient at beside, the patient agrees with the plan and has no further questions at this time.   Labs Review Labs Reviewed - No data to display  Imaging Review No results found.   EKG Interpretation None      MDM   Final diagnoses:  None    Patient presents with complaints of urinary retention since a procedure performed earlier today. She had general anesthesia and has been unable to urinate since that time. She feels pressure. Bladder scanner reveals approximately 500 mL of residual urine after she was able to pass only a few drops.  Multiple attempts by the nursing staff to place a Foley catheter were unsuccessful. Shortly afterward, I was personally able to place a Foley and obtained approximately 450 mL of clear yellow urine. There is no evidence for UTI. I discussed removal versus leaving the Foley in place. The patient will leave the Foley in place for the next 24 hours, then return for follow-up with her primary Dr. for removal.  I personally performed the services described in this documentation, which was scribed in my presence. The recorded information has been reviewed and is accurate.      Janet Speak, MD 05/13/14 [phone]

## 2014-05-13 NOTE — ED Notes (Signed)
Patient states she had laparoscopic surgery yesterday and came back to the ED last night due to being unable to void. States they put in a urinary catheter and advised her to come back to day and have it removed.

## 2014-05-13 NOTE — ED Notes (Signed)
Pt. Reports "I was able to get a few drops out but that's it".

## 2014-05-13 NOTE — ED Notes (Signed)
This RN attempted x1 and Janet Gibson, NT attempted x1 to place foley catheter. No urine returned. Placement unsuccessful. Pt. Tolerated well. EDP notified.

## 2014-05-13 NOTE — ED Notes (Signed)
Emptied leg bag of 100 ml urine. Removed 10 ml from balloon and removed foley catheter. Patient tolerated well.

## 2014-05-13 NOTE — ED Notes (Signed)
Attempted twice to place foley catheter.  Unsuccessful placement.

## 2014-05-14 NOTE — Progress Notes (Signed)
Call was transferred from the front office to me and pt informed me that she recently had surgery and came from the ER yesterday for removal of foley cath.  She stated that as she got up to go the bathroom she started feeling lightheaded and dizziness within the past hour.  Pt stated she did not know if she should be concerned.  I asked pt if she had eaten and drink anything today- she stated potato soup and that she has been drinking plenty of water.  I advised pt to relax for at least hour, make her environment conducive to resting, continue to well hydrate, and if she does not feel better to please go to MAU.  Pt stated understanding with no further questions.

## 2014-05-14 NOTE — ED Provider Notes (Signed)
CSN: [id number]     Arrival date & time 05/13/14  1355 History   First MD Initiated Contact with Patient 05/13/14 1518     Chief Complaint  Patient presents with  . catheter removal      (Consider location/radiation/quality/duration/timing/severity/associated sxs/prior Treatment) HPI  Janet Gibson is a 39 y.o. female who presents to the Emergency Department requesting removal of a foley catheter.  She states she was seen here the evening prior for urinary retention after having laparoscopic surgery for ovarian cyst and scar tissue removal from her intestine.  She reports being unable to void post procedure and had a catheter placed.  She denies problems from the catheter and states she has emptied the leg bag twice.  She states she was advised to return this evening to have the catheter removed.  She denies vomiting, fever, bloody or cloudy urine.   Past Medical History  Diagnosis Date  . Abnormal Pap smear of cervix   . Ovarian cyst   . Anxiety   . Depression   . GERD (gastroesophageal reflux disease)   . Headache     migraines  . Constipation    Past Surgical History  Procedure Laterality Date  . Colposcopy    . Wisdom tooth extraction    . Laparoscopy N/A 05/12/2014    Procedure: LAPAROSCOPY DIAGNOSTIC WITH CHROMO IPERTUBATION WITH BILATERAL PERITUBAL CYST DRAINAGE;  Surgeon: [name], MD;  Location: [facility];  Service: Gynecology;  Laterality: N/A;   Family History  Problem Relation Age of Onset  . Heart disease Mother   . Heart disease Father   . Hypertension Mother   . Colon cancer Paternal Grandfather    History  Substance Use Topics  . Smoking status: Never Smoker   . Smokeless tobacco: Never Used  . Alcohol Use: 1.8 oz/week    3 Not specified per week   OB History    Gravida Para Term Preterm AB TAB SAB Ectopic Multiple Living   0 0 0 0 0 0 0 0 0 0      Review of Systems  Constitutional: Negative for fever, chills, activity change and appetite  change.  Gastrointestinal: Negative for nausea, vomiting and abdominal pain.  Genitourinary: Negative for frequency, hematuria, flank pain, difficulty urinating and pelvic pain.  Musculoskeletal: Negative for back pain.  All other systems reviewed and are negative.     Allergies  Review of patient's allergies indicates no known allergies.  Home Medications   Prior to Admission medications   Medication Sig Start Date End Date Taking? Authorizing Provider  cetirizine (ZYRTEC) 10 MG tablet Take 10 mg by mouth daily.    Historical Provider, MD  docusate sodium (COLACE) 100 MG capsule Take 100 mg by mouth as needed for mild constipation.    Historical Provider, MD  ibuprofen (ADVIL,MOTRIN) 200 MG tablet Take 200 mg by mouth as needed for moderate pain.    Historical Provider, MD  multivitamin-iron-minerals-folic acid (CENTRUM) chewable tablet Chew 1 tablet by mouth daily.    Historical Provider, MD  Norethindrone-Ethinyl Estradiol-Fe Biphas (LO LOESTRIN FE) 1 MG-10 MCG / 10 MCG tablet Take 1 tablet by mouth daily. 02/15/14   [name], MD  oxyCODONE-acetaminophen (PERCOCET/ROXICET) 5-325 MG per tablet Take 1-2 tablets by mouth every 6 (six) hours as needed. Patient not taking: Reported on 05/13/2014 05/12/14   [name], MD   BP 151/85 mmHg  Pulse 66  Temp(Src) 98.3 F (36.8 C) (Oral)  Resp 14  Ht 5' 5"  (  1.651 m)  Wt 126 lb (57.153 kg)  BMI 20.97 kg/m2  SpO2 100%  LMP 04/25/2014 (Approximate) Physical Exam  Cardiovascular: Normal rate, regular rhythm, normal heart sounds and intact distal pulses.   No murmur heard. Pulmonary/Chest: Effort normal and breath sounds normal. No respiratory distress.  Abdominal: Soft. Bowel sounds are normal. She exhibits no distension. There is no tenderness.  Genitourinary:  Foley catheter with leg bag in place.  Bag appears to have approximately 75-100 cc of straw colored urine.    Musculoskeletal: Normal range of motion.  Neurological: She  is alert. She exhibits normal muscle tone. Coordination normal.  Skin: Skin is warm and dry.  Psychiatric: She has a normal mood and affect.  Nursing note and vitals reviewed.   ED Course  Procedures (including critical care time) Labs Review Labs Reviewed - No data to display  Imaging Review No results found.   EKG Interpretation None      MDM   Final diagnoses:  Encounter for Foley catheter removal    patient is well appearing.  abd is soft, BS's present, vitals stable.    Catheter removed by nursing staff without difficulty.  Pt tolerated well.  Denies problems at this time. Advised to return again if unable to void again.   Appears stable for d/c and agrees to return if needed.      Ayriana Wix L. Van[place], PA-C 05/14/14 [place], MD 05/15/14 1144

## 2014-05-14 NOTE — Progress Notes (Signed)
Erroneous visit

## 2014-05-17 ENCOUNTER — Telehealth: Payer: Self-pay | Admitting: *Deleted

## 2014-05-17 NOTE — Telephone Encounter (Signed)
Janet Gibson left a message 05/14/14 that she had surgery 05/02/14 and Dr. Roselie Awkward did laparoscopic surgery. States she has a follow up scheduled for 06/07/14 but for now is having a few odd symptoms like dizziness/ lightheadedness that goes away when she lays down- but comes right back after she gets up and moves around a little while.  Wants to ask Dr. Roselie Awkward if that is normal.    Anise Salvo and left a message that I was returning her call- please call clinic .

## 2014-05-19 NOTE — Telephone Encounter (Addendum)
Per chart review it does not appear Janet Gibson has came to MAU since she called. She did go to ER 05/13/14. She did call 05/14/14 about similar problems and was told to go to MAU . Called [facility] and left another message we are returning your call. Please call the clinic if you are still having problems. If your problems are severe be aware you can go to MAU 24 hours a day/ 7 days a week.

## 2014-05-26 ENCOUNTER — Ambulatory Visit (INDEPENDENT_AMBULATORY_CARE_PROVIDER_SITE_OTHER): Payer: BLUE CROSS/BLUE SHIELD | Admitting: Family Medicine

## 2014-05-26 ENCOUNTER — Encounter: Payer: Self-pay | Admitting: Family Medicine

## 2014-05-26 VITALS — BP 134/95 | HR 78 | Temp 98.3°F | Ht 65.0 in | Wt 128.5 lb

## 2014-05-26 DIAGNOSIS — Z9889 Other specified postprocedural states: Secondary | ICD-10-CM

## 2014-05-26 DIAGNOSIS — N8320 Unspecified ovarian cysts: Secondary | ICD-10-CM

## 2014-05-26 DIAGNOSIS — N83202 Unspecified ovarian cyst, left side: Secondary | ICD-10-CM

## 2014-05-26 NOTE — Patient Instructions (Signed)

## 2014-05-26 NOTE — Assessment & Plan Note (Signed)
S/p laparoscopy--doing well--should continue to get better. No obvious ovarian cysts on u/s. Still contemplating child bearing.  Continue OC's for now.

## 2014-05-26 NOTE — Progress Notes (Signed)
    Subjective:    Patient ID: Janet Gibson is a 39 y.o. female presenting with Routine Post Op  on 05/26/2014  HPI: Underwent diagnostic laparoscopy with drainage of paratubal cysts and adhesiolysis of rt. Bowel to adnexa and chromopertubation. Notes some dizziness and lightheadedness post surgery, but this is improved. Vision was affected with scopolamine patch.  Had heavy feeling in right arm, and has h/o migraines.  Arm still feels weird. No weakness or tingling.  Review of Systems  Constitutional: Negative for fever and chills.  Respiratory: Negative for shortness of breath.   Cardiovascular: Negative for chest pain.  Gastrointestinal: Negative for nausea, vomiting and abdominal pain.  Genitourinary: Negative for dysuria.  Musculoskeletal: Positive for myalgias.  Skin: Negative for rash.      Objective:    BP 134/95 mmHg  Pulse 78  Temp(Src) 98.3 F (36.8 C) (Oral)  Ht 5' 5"  (1.651 m)  Wt 128 lb 8 oz (58.287 kg)  BMI 21.38 kg/m2  LMP 04/25/2014 (Approximate) Physical Exam  Constitutional: She is oriented to person, place, and time. She appears well-developed and well-nourished. No distress.  HENT:  Head: Normocephalic and atraumatic.  Eyes: No scleral icterus.  Neck: Neck supple.  Cardiovascular: Normal rate.   Pulmonary/Chest: Effort normal.  Abdominal: Soft.  Neurological: She is alert and oriented to person, place, and time.  Skin: Skin is warm and dry.  Psychiatric: She has a normal mood and affect.        Assessment & Plan:   Problem List Items Addressed This Visit      Unprioritized   Left ovarian cyst - Primary    S/p laparoscopy--doing well--should continue to get better. No obvious ovarian cysts on u/s. Still contemplating child bearing.  Continue OC's for now.         Return in about 4 weeks (around 06/23/2014).

## 2014-06-23 ENCOUNTER — Ambulatory Visit: Payer: BLUE CROSS/BLUE SHIELD | Admitting: Obstetrics & Gynecology

## 2015-03-14 ENCOUNTER — Other Ambulatory Visit: Payer: Self-pay | Admitting: Obstetrics & Gynecology

## 2015-04-05 ENCOUNTER — Telehealth: Payer: Self-pay | Admitting: *Deleted

## 2015-04-05 MED ORDER — NORETHIN-ETH ESTRAD-FE BIPHAS 1 MG-10 MCG / 10 MCG PO TABS: 1.0000 | ORAL_TABLET | Freq: Every day | ORAL | Status: DC

## 2015-04-05 NOTE — Telephone Encounter (Signed)
Pt left message requesting refill of Lo Lo-estrin OCP. Per chart review, pt last seen on 05/26/14 for post op visit after diagnostic laparoscopy. She has no pending appointments. She is past due for annual Gyn exam. Last Pap done 02/15/14 - not due until 02/15/17. I called pt and left message on her personal voice mail that I will send a 3 month refill to her pharmacy. She is due for annual exam and I will have a staff member call her with an appointment for January or February.

## 2015-05-09 ENCOUNTER — Ambulatory Visit: Payer: BLUE CROSS/BLUE SHIELD | Admitting: Advanced Practice Midwife

## 2015-06-03 ENCOUNTER — Ambulatory Visit: Payer: BLUE CROSS/BLUE SHIELD | Admitting: Obstetrics & Gynecology

## 2015-06-17 ENCOUNTER — Ambulatory Visit: Payer: BLUE CROSS/BLUE SHIELD | Admitting: Obstetrics & Gynecology

## 2015-07-14 ENCOUNTER — Encounter: Payer: Self-pay | Admitting: Certified Nurse Midwife

## 2015-07-14 ENCOUNTER — Ambulatory Visit (INDEPENDENT_AMBULATORY_CARE_PROVIDER_SITE_OTHER): Payer: BLUE CROSS/BLUE SHIELD | Admitting: Certified Nurse Midwife

## 2015-07-14 VITALS — BP 128/95 | HR 72 | Temp 98.5°F

## 2015-07-14 DIAGNOSIS — Z01419 Encounter for gynecological examination (general) (routine) without abnormal findings: Secondary | ICD-10-CM | POA: Diagnosis not present

## 2015-07-14 DIAGNOSIS — Z124 Encounter for screening for malignant neoplasm of cervix: Secondary | ICD-10-CM | POA: Diagnosis not present

## 2015-07-14 DIAGNOSIS — Z1239 Encounter for other screening for malignant neoplasm of breast: Secondary | ICD-10-CM

## 2015-07-14 DIAGNOSIS — L729 Follicular cyst of the skin and subcutaneous tissue, unspecified: Secondary | ICD-10-CM | POA: Diagnosis not present

## 2015-07-14 NOTE — Progress Notes (Signed)
Patient ID: Janet Gibson, female   DOB: 1975-12-31, 40 y.o.   MRN: [id number]    [place] PREVENTATIVE CARE ENCOUNTER NOTE  Subjective:   Janet Gibson is a 40 y.o. G0P0000 female here for a routine annual gynecologic exam.  Current complaints: none.   Denies abnormal vaginal bleeding, discharge, pelvic pain, problems with intercourse or other gynecologic concerns.    Gynecologic History Patient's last menstrual period was 06/24/2015. Contraception: none Results were: normal   Obstetric History OB History  Gravida Para Term Preterm AB SAB TAB Ectopic Multiple Living  0 0 0 0 0 0 0 0 0 0         Past Medical History  Diagnosis Date  . Abnormal Pap smear of cervix   . Ovarian cyst   . Anxiety   . Depression   . GERD (gastroesophageal reflux disease)   . Headache     migraines  . Constipation     Past Surgical History  Procedure Laterality Date  . Colposcopy    . Wisdom tooth extraction    . Laparoscopy N/A 05/12/2014    Procedure: LAPAROSCOPY DIAGNOSTIC WITH CHROMO IPERTUBATION WITH BILATERAL PERITUBAL CYST DRAINAGE;  Surgeon: [name], MD;  Location: [facility];  Service: Gynecology;  Laterality: N/A;    Current Outpatient Prescriptions on File Prior to Visit  Medication Sig Dispense Refill  . cetirizine (ZYRTEC) 10 MG tablet Take 10 mg by mouth daily.    Marland Kitchen docusate sodium (COLACE) 100 MG capsule Take 100 mg by mouth as needed for mild constipation. Reported on 07/14/2015    . ibuprofen (ADVIL,MOTRIN) 200 MG tablet Take 200 mg by mouth as needed for moderate pain. Reported on 07/14/2015    . multivitamin-iron-minerals-folic acid (CENTRUM) chewable tablet Chew 1 tablet by mouth daily. Reported on 07/14/2015    . Norethindrone-Ethinyl Estradiol-Fe Biphas (LO LOESTRIN FE) 1 MG-10 MCG / 10 MCG tablet Take 1 tablet by mouth daily. (Patient not taking: Reported on 07/14/2015) 28 tablet 2   No current facility-administered medications on file prior to  visit.    No Known Allergies  Social History   Social History  . Marital Status: Married    Spouse Name: N/A  . Number of Children: N/A  . Years of Education: N/A   Occupational History  . Not on file.   Social History Main Topics  . Smoking status: Never Smoker   . Smokeless tobacco: Never Used  . Alcohol Use: 1.8 oz/week    3 Standard drinks or equivalent per week  . Drug Use: No  . Sexual Activity:    Partners: Male    Birth Control/ Protection: Pill   Other Topics Concern  . Not on file   Social History Narrative    Family History  Problem Relation Age of Onset  . Heart disease Mother   . Heart disease Father   . Hypertension Mother   . Colon cancer Paternal Grandfather     The following portions of the patient's history were reviewed and updated as appropriate: allergies, current medications, past family history, past medical history, past social history, past surgical history and problem list.  Review of Systems Pertinent items are noted in HPI.   Objective:  BP 128/95 mmHg  Pulse 72  Temp(Src) 98.5 F (36.9 C) (Oral)  LMP 06/24/2015 CONSTITUTIONAL: Well-developed, well-nourished female in no acute distress.  HENT:  Normocephalic, atraumatic, External right and left ear normal. Oropharynx is clear and moist EYES: Conjunctivae and EOM are normal. Pupils  are equal, round, and reactive to light. No scleral icterus.  NECK: Normal range of motion, supple, no masses.  Normal thyroid.  SKIN: Skin is warm and dry. No rash noted. Not diaphoretic. No erythema. No pallor. NEUROLOGIC: Alert and oriented to person, place, and time. Normal reflexes, muscle tone coordination. No cranial nerve deficit noted. PSYCHIATRIC: Normal mood and affect. Normal behavior. Normal judgment and thought content. CARDIOVASCULAR: Normal heart rate noted, regular rhythm RESPIRATORY: Clear to auscultation bilaterally. Effort and breath sounds normal, no problems with respiration  noted. BREASTS: Symmetric in size. No masses, skin changes, nipple drainage, or lymphadenopathy. ABDOMEN: Soft, normal bowel sounds, no distention noted.  No tenderness, rebound or guarding.  PELVIC: Normal appearing external genitalia; normal appearing vaginal mucosa and cervix.  No abnormal discharge noted.  Pap smear obtained.  Normal uterine size, no other palpable masses, no uterine or adnexal tenderness. MUSCULOSKELETAL: Normal range of motion. No tenderness.  No cyanosis, clubbing, or edema.  2+ distal pulses.   Assessment:  Annual gynecologic examination with pap smear   Plan:  Will follow up results of pap smear and manage accordingly. Mammogram scheduled Routine preventative health maintenance measures emphasized. Please refer to After Visit Summary for other counseling recommendations.  Follow up with PCP/Dermatology for cyst on buttocks  [place] [facility]

## 2015-07-14 NOTE — Addendum Note (Signed)
Addended by: Riccardo Dubin on: 07/14/2015 04:12 PM   Modules accepted: Orders

## 2015-07-18 LAB — CYTOLOGY - PAP

## 2015-08-02 ENCOUNTER — Ambulatory Visit
Admission: RE | Admit: 2015-08-02 | Discharge: 2015-08-02 | Disposition: A | Discharge status: Home / Self Care | Payer: BLUE CROSS/BLUE SHIELD | Source: Ambulatory Visit | Attending: Certified Nurse Midwife | Admitting: Certified Nurse Midwife

## 2015-08-02 DIAGNOSIS — Z1239 Encounter for other screening for malignant neoplasm of breast: Secondary | ICD-10-CM

## 2015-09-14 ENCOUNTER — Encounter: Payer: Self-pay | Admitting: Gastroenterology

## 2015-09-19 ENCOUNTER — Ambulatory Visit (INDEPENDENT_AMBULATORY_CARE_PROVIDER_SITE_OTHER): Payer: BLUE CROSS/BLUE SHIELD | Admitting: Gastroenterology

## 2015-09-19 ENCOUNTER — Encounter: Payer: Self-pay | Admitting: Gastroenterology

## 2015-09-19 VITALS — BP 120/76 | HR 72 | Ht 65.0 in | Wt 135.0 lb

## 2015-09-19 DIAGNOSIS — K5900 Constipation, unspecified: Secondary | ICD-10-CM

## 2015-09-19 MED ORDER — NA SULFATE-K SULFATE-MG SULF 17.5-3.13-1.6 GM/177ML PO SOLN: 1.0000 | Freq: Once | ORAL | Status: DC

## 2015-09-19 MED ORDER — LINACLOTIDE 145 MCG PO CAPS: 145.0000 ug | ORAL_CAPSULE | Freq: Every day | ORAL | Status: DC

## 2015-09-19 NOTE — Progress Notes (Signed)
HPI: This is a    very pleasant 40 year old woman   who was referred to me by [name], MD  to evaluate  chronic constipation, intermittent abdominal pains .    Chief complaint is chronic constipation, intermittent abdominal pains  Adhesions on right sided abd found during 2016 lap gyne surgery.  Constipation here whole life.  Has been associated with cramping, doubling over.  Pains improved after adhesion surgery 2016. Op note review:  "RLQ adhesions."  During period she has no constipatoin but when she is not on her menstrual cycle she has significant constipation. She always has to strain to move her bowels. She does not see blood.  Off period very constipated, will have 2bm per week if lucky AND taking colace daily.  She tried miralax, this was semi effective.  Will take ex lax for rescue only.  Tried fiber supplements, didn't help very well.  Never had a colonoscopy.  Paternal grendfather had colon cancer.  Overall weight up a bit.  She's never had trouble with diarrhea except 2001 vomiting/diarrheal illness (thought it was food poisoining  Review of systems: Pertinent positive and negative review of systems were noted in the above HPI section. Complete review of systems was performed and was otherwise normal.   Past Medical History  Diagnosis Date  . Abnormal Pap smear of cervix   . Ovarian cyst   . Anxiety   . Depression   . GERD (gastroesophageal reflux disease)   . Headache     migraines  . Constipation     Past Surgical History  Procedure Laterality Date  . Colposcopy    . Wisdom tooth extraction    . Laparoscopy N/A 05/12/2014    Procedure: LAPAROSCOPY DIAGNOSTIC WITH CHROMO IPERTUBATION WITH BILATERAL PERITUBAL CYST DRAINAGE;  Surgeon: [name], MD;  Location: [facility];  Service: Gynecology;  Laterality: N/A;    Current Outpatient Prescriptions  Medication Sig Dispense Refill  . cetirizine (ZYRTEC) 10 MG tablet Take 10 mg by mouth daily.    Marland Kitchen  docusate sodium (COLACE) 100 MG capsule Take 100 mg by mouth as needed for mild constipation. Reported on 07/14/2015    . ibuprofen (ADVIL,MOTRIN) 200 MG tablet Take 200 mg by mouth as needed for moderate pain. Reported on 09/19/2015    . multivitamin-iron-minerals-folic acid (CENTRUM) chewable tablet Chew 1 tablet by mouth daily. Reported on 07/14/2015    . pseudoephedrine (SUDAFED) 30 MG tablet Take 30 mg by mouth every 4 (four) hours as needed for congestion.    Marland Kitchen PENNSAID 2 % SOLN apply TWO Pumps ON SKIN apply TO affected AREA TWICE DAILY AS NEEDED]  2   No current facility-administered medications for this visit.    Allergies as of 09/19/2015  . (No Known Allergies)    Family History  Problem Relation Age of Onset  . Heart disease Mother   . Heart disease Father   . Hypertension Mother   . Colon cancer Paternal Grandfather     Social History   Social History  . Marital Status: Married    Spouse Name: N/A  . Number of Children: N/A  . Years of Education: N/A   Occupational History  . Not on file.   Social History Main Topics  . Smoking status: Never Smoker   . Smokeless tobacco: Never Used  . Alcohol Use: 1.8 oz/week    3 Standard drinks or equivalent per week  . Drug Use: No  . Sexual Activity:    Partners:  Male    Birth Control/ Protection: Pill   Other Topics Concern  . Not on file   Social History Narrative     Physical Exam: BP 120/76 mmHg  Pulse 72  Ht 5' 5"  (1.651 m)  Wt 135 lb (61.236 kg)  BMI 22.47 kg/m2  LMP 09/13/2015 (Approximate) Constitutional: generally well-appearing Psychiatric: alert and oriented x3 Eyes: extraocular movements intact Mouth: oral pharynx moist, no lesions Neck: supple no lymphadenopathy Cardiovascular: heart regular rate and rhythm Lungs: clear to auscultation bilaterally Abdomen: soft, nontender, nondistended, no obvious ascites, no peritoneal signs, normal bowel sounds Extremities: no lower extremity edema  bilaterally Skin: no lesions on visible extremities   Assessment and plan: 40 y.o. female with  Chronic constipation, intermittent abdominal pains, right lower quadrant adhesions of unclear etiology during gynecologic surgery 2016  Unclear why she had adhesions in her right lower quadrant during the gynecologic surgery in 2016. Perhaps some transient inflammatory illness, perhaps infectious illness. Her biggest issue now seems to be that of chronic constipation. She always has to strain to move her bowels. She is bothered by intermittent abdominal pains that often seemed to improve when she moves her bowels. She has tried MiraLAX, Metamucil, she takes Colace regularly. The other agents did not help. I'm going to try her on Linzess one pill once daily and we'll proceed with colonoscopy at her soonest convenience to rule out stricturing, structural disease. I also advised her to try to stay hydrated as that can certainly improve constipation.   Owens Loffler, MD [facility] 09/19/2015, 2:34 PM  Cc: [name], MD

## 2015-09-19 NOTE — Patient Instructions (Addendum)
You will be set up for a colonoscopy. Trial of linzess 190mg pill, one pill once daily. (30 pills with 6 refills) Try to stay hydrated better.

## 2015-10-21 ENCOUNTER — Encounter: Payer: Self-pay | Admitting: Gastroenterology

## 2015-10-21 ENCOUNTER — Ambulatory Visit (AMBULATORY_SURGERY_CENTER): Payer: BLUE CROSS/BLUE SHIELD | Admitting: Gastroenterology

## 2015-10-21 VITALS — BP 117/68 | HR 57 | Temp 99.6°F | Resp 12 | Ht 65.0 in | Wt 135.0 lb

## 2015-10-21 DIAGNOSIS — D120 Benign neoplasm of cecum: Secondary | ICD-10-CM | POA: Diagnosis not present

## 2015-10-21 DIAGNOSIS — K5900 Constipation, unspecified: Secondary | ICD-10-CM | POA: Diagnosis not present

## 2015-10-21 MED ORDER — SODIUM CHLORIDE 0.9 % IV SOLN: 500.0000 mL | INTRAVENOUS | Status: DC

## 2015-10-21 NOTE — Progress Notes (Signed)
Called to room to assist during endoscopic procedure.  Patient ID and intended procedure confirmed with present staff. Received instructions for my participation in the procedure from the performing physician.  

## 2015-10-21 NOTE — Progress Notes (Signed)
Report to PACU, RN, vss, BBS= Clear.  

## 2015-10-21 NOTE — Op Note (Signed)
[facility] Patient Name: Janet Gibson Procedure Date: 10/21/2015 2:09 PM MRN: [id number] Endoscopist: Milus Banister , MD Age: 40 Referring MD:  Date of Birth: 1976/03/22 Gender: Female Account #: [id number] Procedure:                Colonoscopy Indications:              Constipation Medicines:                Monitored Anesthesia Care Procedure:                Pre-Anesthesia Assessment:                           - Prior to the procedure, a History and Physical                            was performed, and patient medications and                            allergies were reviewed. The patient's tolerance of                            previous anesthesia was also reviewed. The risks                            and benefits of the procedure and the sedation                            options and risks were discussed with the patient.                            All questions were answered, and informed consent                            was obtained. Prior Anticoagulants: The patient has                            taken no previous anticoagulant or antiplatelet                            agents. ASA Grade Assessment: II - A patient with                            mild systemic disease. After reviewing the risks                            and benefits, the patient was deemed in                            satisfactory condition to undergo the procedure.                           After obtaining informed consent, the colonoscope  was passed under direct vision. Throughout the                            procedure, the patient's blood pressure, pulse, and                            oxygen saturations were monitored continuously. The                            Model PCF-[id number]) scope was introduced                            through the anus and advanced to the the cecum,                            identified by appendiceal orifice and  ileocecal                            valve. The colonoscopy was performed without                            difficulty. The patient tolerated the procedure                            well. The quality of the bowel preparation was                            excellent. The ileocecal valve, appendiceal                            orifice, and rectum were photographed. Scope In: 2:21:24 PM Scope Out: 2:32:44 PM Scope Withdrawal Time: 0 hours 7 minutes 56 seconds  Total Procedure Duration: 0 hours 11 minutes 20 seconds  Findings:                 A 4 mm polyp was found in the cecum. The polyp was                            sessile. The polyp was removed with a cold snare.                            Resection and retrieval were complete.                           The exam was otherwise without abnormality on                            direct and retroflexion views. Complications:            No immediate complications. Estimated blood loss:                            None. Estimated Blood Loss:     Estimated blood loss: none. Impression:               -  One 4 mm polyp in the cecum, removed with a cold                            snare. Resected and retrieved.                           - The examination was otherwise normal on direct                            and retroflexion views. Recommendation:           - Patient has a contact number available for                            emergencies. The signs and symptoms of potential                            delayed complications were discussed with the                            patient. Return to normal activities tomorrow.                            Written discharge instructions were provided to the                            patient.                           - Resume previous diet.                           - Continue present medications. Please start once                            daily OTC powder fiber supplement (citrucel). Take                             one spoonful daily. Continue your linzess once                            daily as well. Please call Dr. Ardis Hughs' office in                            4-6 weeks to report on your response.                           You will receive a letter within 2-3 weeks with the                            pathology results and my final recommendations.                           If the polyp(s) is proven to be 'pre-cancerous' on  pathology, you will need repeat colonoscopy in 5                            years. If the polyp(s) is NOT 'precancerous' on                            pathology then you should repeat colon cancer                            screening in 10 years with colonoscopy without need                            for colon cancer screening by any method prior to                            then (including stool testing). Milus Banister, MD 10/21/2015 2:36:03 PM This report has been signed electronically.

## 2015-10-21 NOTE — Patient Instructions (Signed)
Discharge instructions given. Handout on polyps. Resume previous medications. YOU HAD AN ENDOSCOPIC PROCEDURE TODAY AT [facility]:   Refer to the procedure report that was given to you for any specific questions about what was found during the examination.  If the procedure report does not answer your questions, please call your gastroenterologist to clarify.  If you requested that your care partner not be given the details of your procedure findings, then the procedure report has been included in a sealed envelope for you to review at your convenience later.  YOU SHOULD EXPECT: Some feelings of bloating in the abdomen. Passage of more gas than usual.  Walking can help get rid of the air that was put into your GI tract during the procedure and reduce the bloating. If you had a lower endoscopy (such as a colonoscopy or flexible sigmoidoscopy) you may notice spotting of blood in your stool or on the toilet paper. If you underwent a bowel prep for your procedure, you may not have a normal bowel movement for a few days.  Please Note:  You might notice some irritation and congestion in your nose or some drainage.  This is from the oxygen used during your procedure.  There is no need for concern and it should clear up in a day or so.  SYMPTOMS TO REPORT IMMEDIATELY:   Following lower endoscopy (colonoscopy or flexible sigmoidoscopy):  Excessive amounts of blood in the stool  Significant tenderness or worsening of abdominal pains  Swelling of the abdomen that is new, acute  Fever of 100F or higher   For urgent or emergent issues, a gastroenterologist can be reached at any hour by calling [phone].   DIET: Your first meal following the procedure should be a small meal and then it is ok to progress to your normal diet. Heavy or fried foods are harder to digest and may make you feel nauseous or bloated.  Likewise, meals heavy in dairy and vegetables can increase bloating.  Drink  plenty of fluids but you should avoid alcoholic beverages for 24 hours.  ACTIVITY:  You should plan to take it easy for the rest of today and you should NOT DRIVE or use heavy machinery until tomorrow (because of the sedation medicines used during the test).    FOLLOW UP: Our staff will call the number listed on your records the next business day following your procedure to check on you and address any questions or concerns that you may have regarding the information given to you following your procedure. If we do not reach you, we will leave a message.  However, if you are feeling well and you are not experiencing any problems, there is no need to return our call.  We will assume that you have returned to your regular daily activities without incident.  If any biopsies were taken you will be contacted by phone or by letter within the next 1-3 weeks.  Please call us at [phone] if you have not heard about the biopsies in 3 weeks.    SIGNATURES/CONFIDENTIALITY: You and/or your care partner have signed paperwork which will be entered into your electronic medical record.  These signatures attest to the fact that that the information above on your After Visit Summary has been reviewed and is understood.  Full responsibility of the confidentiality of this discharge information lies with you and/or your care-partner.

## 2015-10-24 ENCOUNTER — Telehealth: Payer: Self-pay

## 2015-10-24 NOTE — Telephone Encounter (Signed)
  Follow up Call-  Call back number 10/21/2015  Post procedure Call Back phone  # [phone]  Permission to leave phone message Yes    Patient was called for follow up after his procedure on 10/21/2015. No answer at the number given for follow up phone call. A message was left on the answering machine.

## 2015-11-02 ENCOUNTER — Encounter: Payer: Self-pay | Admitting: Gastroenterology

## 2015-12-02 ENCOUNTER — Ambulatory Visit (HOSPITAL_COMMUNITY)
Admission: EM | Admit: 2015-12-02 | Discharge: 2015-12-02 | Disposition: A | Discharge status: Home / Self Care | Payer: BLUE CROSS/BLUE SHIELD

## 2015-12-02 ENCOUNTER — Other Ambulatory Visit: Payer: Self-pay | Admitting: Internal Medicine

## 2015-12-02 ENCOUNTER — Ambulatory Visit (INDEPENDENT_AMBULATORY_CARE_PROVIDER_SITE_OTHER): Payer: BLUE CROSS/BLUE SHIELD | Admitting: Family Medicine

## 2015-12-02 VITALS — BP 138/87 | HR 76 | Temp 98.2°F | Resp 16 | Ht 65.0 in | Wt 135.4 lb

## 2015-12-02 DIAGNOSIS — R200 Anesthesia of skin: Secondary | ICD-10-CM | POA: Diagnosis not present

## 2015-12-02 DIAGNOSIS — R51 Headache: Secondary | ICD-10-CM | POA: Diagnosis not present

## 2015-12-02 DIAGNOSIS — R221 Localized swelling, mass and lump, neck: Secondary | ICD-10-CM

## 2015-12-02 DIAGNOSIS — M5442 Lumbago with sciatica, left side: Secondary | ICD-10-CM | POA: Diagnosis not present

## 2015-12-02 DIAGNOSIS — G8929 Other chronic pain: Secondary | ICD-10-CM

## 2015-12-02 MED ORDER — GABAPENTIN 100 MG PO CAPS: 100.0000 mg | ORAL_CAPSULE | Freq: Three times a day (TID) | ORAL | 3 refills | Status: DC

## 2015-12-02 NOTE — Patient Instructions (Addendum)
As we discussed report to the Emergency Room if you begin to experience worsening facial weakness, facial drooping, slurred speech, or headache unable to be relieved by your regular regimen.  Neurology will contact you to schedule your appointment.  Take Gabapentin 100 mg up to 3 times daily for low back pain as needed.     IF you received an x-ray today, you will receive an invoice from [facility]. Please contact [facility] at [phone] with questions or concerns regarding your invoice.   IF you received labwork today, you will receive an invoice from [employer]. Please contact [facility] at [phone] with questions or concerns regarding your invoice.   Our billing staff will not be able to assist you with questions regarding bills from these companies.  You will be contacted with the lab results as soon as they are available. The fastest way to get your results is to activate your My Chart account. Instructions are located on the last page of this paperwork. If you have not heard from [place] regarding the results in 2 weeks, please contact this office.

## 2015-12-02 NOTE — Progress Notes (Signed)
Patient ID: Janet Gibson, female    DOB: 09/17/1975, 40 y.o.   MRN: [id number]  PCP: Emeterio Reeve, MD  Chief Complaint  Patient presents with  . Migraine    tingling on the left side of her body, with shooting pain down left leg since 4am this morning     Subjective:   HPI Presents for evaluation of left jaw numbness accompanying headache.  Headache  Patient reports several years of experiencing self diagnosed "migraine" headaches.  Upon awakening this morning, patient reports left cheek and jaw tingling, progressing to mild numbness. Denies slurring of speech, changes in gait or balance, dizziness, or syncope.  She explains having a very physically exerting day prior to the onset of this headache while completing yard work.  For the headache she typically takes  Ibuprofen 800 mg and drinks a cup of caffiene. She reports typically, her headaches resolve within 24 hours after following this regimen.  She is not followed by neurology nor has she received a headache work-up.   Low Back Pain with Sciatica  Reports lower lumbar-sacral pain after working in the yard times 1 days ago.  She is experiencing some intermittent shooting pain up and down leg left leg with tingling and numbness.  She has taken Ibuprofen with minimal relief. She has experienced similar low back pain prior.  . Social History   Social History  . Marital status: Married    Spouse name: N/A  . Number of children: N/A  . Years of education: N/A   Occupational History  . Not on file.   Social History Main Topics  . Smoking status: Never Smoker  . Smokeless tobacco: Never Used  . Alcohol use 1.8 oz/week    3 Standard drinks or equivalent per week     Comment: occ.  . Drug use: No  . Sexual activity: Yes    Partners: Male    Birth control/ protection: Pill   Other Topics Concern  . Not on file   Social History Narrative  . No narrative on file   . Family History  Problem Relation Age of Onset  .  Heart disease Mother   . Hypertension Mother   . Heart disease Father   . Hypertension Father   . Colon cancer Paternal Grandfather   . Colitis Paternal Grandmother     Review of Systems  Constitutional: Negative.   Eyes: Negative for photophobia and visual disturbance.  Respiratory: Negative.   Cardiovascular: Negative.   Neurological: Positive for numbness and headaches.       See HPI    Patient Active Problem List   Diagnosis Date Noted  . Well woman exam with routine gynecological exam 07/14/2015  . Left ovarian cyst 02/15/2014    Prior to Admission medications   Medication Sig Start Date End Date Taking? Authorizing Provider  cetirizine (ZYRTEC) 10 MG tablet Take 10 mg by mouth daily.   Yes Historical Provider, MD  ibuprofen (ADVIL,MOTRIN) 200 MG tablet Take 200 mg by mouth as needed for moderate pain. Reported on 09/19/2015   Yes Historical Provider, MD  linaclotide [facility]) 145 MCG CAPS capsule Take 1 capsule (145 mcg total) by mouth daily before breakfast. 09/19/15  Yes Milus Banister, MD  multivitamin-iron-minerals-folic acid (CENTRUM) chewable tablet Chew 1 tablet by mouth daily. Reported on 07/14/2015   Yes Historical Provider, MD  pseudoephedrine (SUDAFED) 30 MG tablet Take 30 mg by mouth every 4 (four) hours as needed for congestion.   Yes Historical Provider, MD  No Known Allergies     Objective:  Physical Exam  Constitutional: She is oriented to person, place, and time. She appears well-developed and well-nourished.  HENT:  Head: Normocephalic and atraumatic.  Right Ear: External ear normal.  Left Ear: External ear normal.  Eyes: Conjunctivae and EOM are normal. Pupils are equal, round, and reactive to light.  Neck: Normal range of motion.  Cardiovascular: Normal rate, regular rhythm, normal heart sounds and intact distal pulses.   Pulmonary/Chest: Effort normal and breath sounds normal.  Musculoskeletal: Normal range of motion.  Neurological: She is alert  and oriented to person, place, and time.  Cerebellar function intact.  CN 2, 3,5, 7 intact. Romberg test-negative  Negative for nystagmus   Skin: Skin is warm and dry.   . Vitals:   12/02/15 1537  BP: 138/87  Pulse: 76  Resp: 16  Temp: 98.2 F (36.8 C)     Assessment & Plan:  1. Numbness of left jaw 2. Chronic intractable headache, unspecified headache type Patient reports an extensive history of regularly experiencing headaches.  Over the last 24 hours, she has developed left sided jaw numbness that has accompanied her headache which is a new occurrence from previous headaches.  She is negative for any other focal symptoms, however I feel its prudent for the patient to have a complete neurological work-up to rule out any additional underlying cause for left sided jaw numbness. -Ambulatory referral to Neurology   3. Midline low back pain with left-sided sciatica Meds ordered this encounter  Medications  . gabapentin (NEURONTIN) 100 MG capsule    Sig: Take 1 capsule (100 mg total) by mouth 3 (three) times daily.   Carroll Sage. Kenton Kingfisher, MSN, FNP-C [facility]

## 2015-12-05 DIAGNOSIS — R519 Headache, unspecified: Secondary | ICD-10-CM | POA: Insufficient documentation

## 2015-12-05 DIAGNOSIS — M5442 Lumbago with sciatica, left side: Secondary | ICD-10-CM | POA: Insufficient documentation

## 2015-12-05 DIAGNOSIS — R51 Headache: Secondary | ICD-10-CM

## 2015-12-05 DIAGNOSIS — R200 Anesthesia of skin: Secondary | ICD-10-CM | POA: Insufficient documentation

## 2015-12-06 ENCOUNTER — Ambulatory Visit
Admission: RE | Admit: 2015-12-06 | Discharge: 2015-12-06 | Disposition: A | Discharge status: Home / Self Care | Payer: BLUE CROSS/BLUE SHIELD | Source: Ambulatory Visit | Attending: Internal Medicine | Admitting: Internal Medicine

## 2015-12-06 DIAGNOSIS — R221 Localized swelling, mass and lump, neck: Secondary | ICD-10-CM

## 2015-12-06 MED ORDER — IOPAMIDOL (ISOVUE-300) INJECTION 61%
75.0000 mL | Freq: Once | INTRAVENOUS | Status: AC | PRN
  Administered 2015-12-06: 75 mL via INTRAVENOUS

## 2015-12-06 MED ORDER — IOPAMIDOL (ISOVUE-370) INJECTION 76%: 75.0000 mL | Freq: Once | INTRAVENOUS | Status: DC | PRN

## 2015-12-29 ENCOUNTER — Encounter: Payer: Self-pay | Admitting: Neurology

## 2015-12-29 ENCOUNTER — Ambulatory Visit (INDEPENDENT_AMBULATORY_CARE_PROVIDER_SITE_OTHER): Payer: BLUE CROSS/BLUE SHIELD | Admitting: Neurology

## 2015-12-29 VITALS — BP 136/91 | HR 75 | Ht 65.0 in | Wt 136.6 lb

## 2015-12-29 DIAGNOSIS — R202 Paresthesia of skin: Secondary | ICD-10-CM | POA: Diagnosis not present

## 2015-12-29 DIAGNOSIS — G43009 Migraine without aura, not intractable, without status migrainosus: Secondary | ICD-10-CM | POA: Diagnosis not present

## 2015-12-29 MED ORDER — ELETRIPTAN HYDROBROMIDE 20 MG PO TABS: 20.0000 mg | ORAL_TABLET | ORAL | 0 refills | Status: DC | PRN

## 2015-12-29 MED ORDER — TOPIRAMATE 25 MG PO TABS: 25.0000 mg | ORAL_TABLET | Freq: Two times a day (BID) | ORAL | 3 refills | Status: DC

## 2015-12-29 NOTE — Progress Notes (Signed)
[facility] [address] [place]. [place] [address] [phone]       OFFICE CONSULT NOTE  [name] Date of Birth:  1975-05-13 Medical Record Number:  [id number]   Referring MD:  Emeterio Reeve  Reason for Referral:  Headaches and numbness  HPI: 36 year lady whose had history of migraine headaches starting since her 51s. She has noticed increase in frequency and severity of her headaches in the last few years as well as more recently tingling sensations on the left side. She states she woke up on August 18 with a terrible migraine as well as noticed tingling initially in the left cheek. Later on in the morning the tingling involving left shoulder as well as spread to the left leg. She takes ibuprofen 800 mg and usually it helps but on this occasion it did not help. She went to urgent care. She is started on, pending 103 times daily which she has been taking. However she continued to have tingling and numbness off and on lasting about 10 minutes or so. She also developed a few days later pain in the left shoulder as well as spasms and on her back on the upper chest. This has been happening intermittently. Interestingly she states when she gets back spasms her arm tingling disappears. The gabapentin 100 mg 3 times daily has not been helping her. The cramps seem to rotate locations but mostly in the upper back and left shoulder. She does complain of tightness in the neck muscles. Most of her migraine headaches interestingly are located in the back of the head. She denies any loss of vision, visual scotoma speech problems and dizziness vertigo gait or balance problems. [place] ago from [place] this area. She was seen the neurologist at [facility]. She remembers having had a brain scan done in 2009 which was normal. Recently patient was given a prednisone taper pack by her primary physician but that has not helped either the paresthesia  of her headaches. She has tried Imitrex years ago but it did not help her migraine. She currently takes it on Namenda milligrams of ibuprofen. She has also been tried in the past on atenolol as well as amitriptyline for migraine prophylaxis but states it did not help. She has not been on Topamax. She has not tried any of the new triptan medications. She denies any family history of migraines. Denies any significant head injury with loss of consciousness. She does admit to getting fatigued easily but denies any vertigo, diplopia, gait imbalance, bladder urgency or incontinence.  ROS:   14 system review of systems is positive for  chills, fatigue, palpitations, ringing in the ears, trouble swallowing, cough, constipation, flushing, cramps, aching muscles, headache, numbness and all other systems negative  PMH:  Past Medical History:  Diagnosis Date  . Abnormal Pap smear of cervix   . Allergy    seasonal  . Anxiety   . Constipation   . Depression   . GERD (gastroesophageal reflux disease)   . Headache    migraines  . Ovarian cyst     Social History:  Social History   Social History  . Marital status: Married    Spouse name: N/A  . Number of children: N/A  . Years of education: N/A   Occupational History  . Not on file.   Social History Main Topics  . Smoking status: Never Smoker  . Smokeless tobacco: Never Used  . Alcohol use  2.4 oz/week    3 Standard drinks or equivalent, 1 Glasses of wine per week     Comment: occ.  . Drug use: No  . Sexual activity: Yes    Partners: Male    Birth control/ protection: Pill   Other Topics Concern  . Not on file   Social History Narrative  . No narrative on file    Medications:   Current Outpatient Prescriptions on File Prior to Visit  Medication Sig Dispense Refill  . cetirizine (ZYRTEC) 10 MG tablet Take 10 mg by mouth daily.    Marland Kitchen ibuprofen (ADVIL,MOTRIN) 200 MG tablet Take 200 mg by mouth as needed for moderate pain. Reported on  09/19/2015    . linaclotide (LINZESS) 145 MCG CAPS capsule Take 1 capsule (145 mcg total) by mouth daily before breakfast. 30 capsule 6  . multivitamin-iron-minerals-folic acid (CENTRUM) chewable tablet Chew 1 tablet by mouth daily. Reported on 07/14/2015    . pseudoephedrine (SUDAFED) 30 MG tablet Take 30 mg by mouth every 4 (four) hours as needed for congestion.     No current facility-administered medications on file prior to visit.     Allergies:  No Known Allergies  Physical Exam General: well developed, well nourished asian young lady, seated, in no evident distress Head: head normocephalic and atraumatic.   Neck: supple with no carotid or supraclavicular bruits Cardiovascular: regular rate and rhythm, no murmurs Musculoskeletal: no deformity. Mild tightness of posterior cervical muscles. Skin:  no rash/petichiae Vascular:  Normal pulses all extremities  Neurologic Exam Mental Status: Awake and fully alert. Oriented to place and time. Recent and remote memory intact. Attention span, concentration and fund of knowledge appropriate. Mood and affect appropriate.  Cranial Nerves: Fundoscopic exam reveals sharp disc margins. Pupils equal, briskly reactive to light. Extraocular movements full without nystagmus. Visual fields full to confrontation. Hearing intact. Facial sensation intact. Face, tongue, palate moves normally and symmetrically.  Motor: Normal bulk and tone. Normal strength in all tested extremity muscles. Sensory.: intact to touch , pinprick , position and vibratory sensation.  Coordination: Rapid alternating movements normal in all extremities. Finger-to-nose and heel-to-shin performed accurately bilaterally. Gait and Station: Arises from chair without difficulty. Stance is normal. Gait demonstrates normal stride length and balance . Able to heel, toe and tandem walk without difficulty.  Reflexes: 1+ and symmetric. Toes downgoing.       ASSESSMENT: 70 year lady with  long-standing history of migraine headaches with recent increase in  frequency, severity as well as intermittent left body paresthesias of unclear etiology.    PLAN: I had a long discussion with the patient with regards to her chronic headaches which seem like migraines but recurrent episodes of left body paresthesias of unclear significance and need further evaluation with MRI scan of the brain with and without contrast. I recommend she try Relpax 20 mg for symptomatic relief of her migraines as well as discontinue gabapentin as it is not working and is to try Topamax 25 mg twice daily for migraine prophylaxis. I have discussed possible side effects with the patient and advised her to call me if needed. I also recommend she do regular neck stretching exercises and increase participation in activities like meditation and yoga for stress relaxation. She was asked to keep a headache diary. Greater than 50% time during this 45 minute consultation visit was spent on counseling and coordination of care about her headaches and paresthesias She will return for follow-up in 2 months or call earlier if necessary.  [place]  [id number] [facility] [address] [place], [place] [address]  Phone [phone] Fax [phone] Note: This document was prepared with digital dictation and possible smart phrase technology. Any transcriptional errors that result from this process are unintentional.

## 2015-12-29 NOTE — Patient Instructions (Addendum)
I had a long discussion with the patient with regards to her chronic headaches which seem like migraines but recurrent episodes of left body paresthesias of unclear significance and need further evaluation with MRI scan of the brain with and without contrast. I recommend she try Relpax 20 mg for symptomatic relief of her migraines as well as discontinue gabapentin as it is not working and is to try Topamax 25 mg twice daily for migraine prophylaxis. I have discussed possible side effects with the patient and advised her to call me if needed. I also recommend she do regular neck stretching exercises and increase participation in activities like meditation and yoga for stress relaxation. She was asked to keep a headache diary. She will return for follow-up in 2 months or call earlier if necessary.  Migraine Headache A migraine headache is an intense, throbbing pain on one or both sides of your head. A migraine can last for 30 minutes to several hours. CAUSES  The exact cause of a migraine headache is not always known. However, a migraine may be caused when nerves in the brain become irritated and release chemicals that cause inflammation. This causes pain. Certain things may also trigger migraines, such as:  Alcohol.  Smoking.  Stress.  Menstruation.  Aged cheeses.  Foods or drinks that contain nitrates, glutamate, aspartame, or tyramine.  Lack of sleep.  Chocolate.  Caffeine.  Hunger.  Physical exertion.  Fatigue.  Medicines used to treat chest pain (nitroglycerine), birth control pills, estrogen, and some blood pressure medicines. SIGNS AND SYMPTOMS  Pain on one or both sides of your head.  Pulsating or throbbing pain.  Severe pain that prevents daily activities.  Pain that is aggravated by any physical activity.  Nausea, vomiting, or both.  Dizziness.  Pain with exposure to bright lights, loud noises, or activity.  General sensitivity to bright lights, loud noises,  or smells. Before you get a migraine, you may get warning signs that a migraine is coming (aura). An aura may include:  Seeing flashing lights.  Seeing bright spots, halos, or zigzag lines.  Having tunnel vision or blurred vision.  Having feelings of numbness or tingling.  Having trouble talking.  Having muscle weakness. DIAGNOSIS  A migraine headache is often diagnosed based on:  Symptoms.  Physical exam.  A CT scan or MRI of your head. These imaging tests cannot diagnose migraines, but they can help rule out other causes of headaches. TREATMENT Medicines may be given for pain and nausea. Medicines can also be given to help prevent recurrent migraines.  HOME CARE INSTRUCTIONS  Only take over-the-counter or prescription medicines for pain or discomfort as directed by your health care provider. The use of long-term narcotics is not recommended.  Lie down in a dark, quiet room when you have a migraine.  Keep a journal to find out what may trigger your migraine headaches. For example, write down:  What you eat and drink.  How much sleep you get.  Any change to your diet or medicines.  Limit alcohol consumption.  Quit smoking if you smoke.  Get 7-9 hours of sleep, or as recommended by your health care provider.  Limit stress.  Keep lights dim if bright lights bother you and make your migraines worse. SEEK IMMEDIATE MEDICAL CARE IF:   Your migraine becomes severe.  You have a fever.  You have a stiff neck.  You have vision loss.  You have muscular weakness or loss of muscle control.  You start losing  your balance or have trouble walking.  You feel faint or pass out.  You have severe symptoms that are different from your first symptoms. MAKE SURE YOU:   Understand these instructions.  Will watch your condition.  Will get help right away if you are not doing well or get worse.   This information is not intended to replace advice given to you by your  health care provider. Make sure you discuss any questions you have with your health care provider.   Document Released: 04/02/2005 Document Revised: 04/23/2014 Document Reviewed: 12/08/2012 Elsevier Interactive Patient Education [employer].

## 2016-01-02 ENCOUNTER — Telehealth: Payer: Self-pay | Admitting: Neurology

## 2016-01-02 NOTE — Telephone Encounter (Signed)
LFt vm for patient about having increase symptoms. Pt was seen 12/29/2015 for new referral for headache. Pt having different symptoms bedside the headache. RN left on vm for her to seek the ED because of tingling from head to toes.

## 2016-01-02 NOTE — Telephone Encounter (Signed)
Thanks. Agree with plan

## 2016-01-02 NOTE — Telephone Encounter (Signed)
Patient call back, RN advise phone staff she needs to seek the ED.

## 2016-01-02 NOTE — Telephone Encounter (Signed)
Patient is calling. She started taking medication topiramate (TOPAMAX) 25 MG tablet  last Thursday. Yesterday she started having a headache and it elevated to severe pain in her head and numbness on her left side, hurting bad in her face, legs. She says has pain and tingling from top of her head to her toes and light headiness. She said she did not go to the ER.  Please call and discuss.

## 2016-01-02 NOTE — Telephone Encounter (Signed)
Pt returned call from nurse. I relayed to the pt per nurses note she needs to go the ER for treatment. Pt expressed understanding with no further questions.

## 2016-01-05 NOTE — Telephone Encounter (Signed)
Patient called, requests call back from nurse to discuss what ER Doctor said, has a few questions, would like appointment ASAP, was here last Thursday for numbness and tingling left side, went to ER, states the numbness and tingling is still really bad. Please call [phone].

## 2016-01-05 NOTE — Telephone Encounter (Signed)
Patient coming in for migraine cocktail thanks

## 2016-01-05 NOTE — Telephone Encounter (Signed)
Rn spoke with patient about her symptoms. PT was seen by Dr.Sethi for headaches, tingling and numbness last week. Pt stated it starts at the top of her head on left side to her toes. It radiates from all on the left side. Pt has a MRi order by Dr. Leonie Man but is pending. Pt went to [facility] stated she went to the ED on 01/02/2016 and they did a Ct scan and blood work.They are testing her for lyme disease. Pt stated the lyme disease has not been confirm yet. They put her on a 21 day doxycyline take 1 pill BID. Rn spoke with Dr.Ahern who is the work in [place]. Dr. Leonie Man is working at the hospital this week. Dr. Jaynee Eagles states pt can come in for a migraine infusion. Rn spoke with Otila Kluver and she can see pt at 0200pm.While on the phone Rn explain to patient that she can drive to [place]. Rn advise her have someone to pick her up because the medication will make her sleepy. PT stated she will be here. She will have someone to pick her up. Rn explain to patient she needs to arrive at 0130pm. Pt verbalized understanding.

## 2016-01-11 ENCOUNTER — Ambulatory Visit (INDEPENDENT_AMBULATORY_CARE_PROVIDER_SITE_OTHER): Payer: BLUE CROSS/BLUE SHIELD

## 2016-01-11 DIAGNOSIS — R202 Paresthesia of skin: Secondary | ICD-10-CM

## 2016-01-12 MED ORDER — GADOPENTETATE DIMEGLUMINE 469.01 MG/ML IV SOLN: 12.0000 mL | Freq: Once | INTRAVENOUS | Status: DC | PRN

## 2016-01-16 NOTE — Telephone Encounter (Signed)
Rn call [facility] about the Relpax medication for the pt. Rn spoke with Gerald Stabs in the pharmacy. Rn ask if the medication requires a PA per patient. Gerald Stabs stated it does not require a PA and they were trying to get the generic form. Gerald Stabs stated the  medication can be filled with the generic form. Gerald Stabs in the pharmacy will call patient.

## 2016-01-16 NOTE — Telephone Encounter (Signed)
MEssage sent to Dr. Krista Blue. Pt is already taking replax and topamax. Pt recently had MRI,and was read by [facility] MD. Pt has not been told of her MRI results.

## 2016-01-16 NOTE — Telephone Encounter (Addendum)
Rn call patient back about her tingling and numbness to the left side of her body. PT did have MRi last week at [facility].The MD has not been interpreted for pt of her results. Pt states there is no headache.PT reported on Friday h.er right hand is swollen. Pt stated she has not been to her PCP about her hand. Pt went to the ED the week of 01/05/2016 at [facility]. Pt was seen by Dr.Ahern work in on 01/05/2016 for headache infusion at [facility]. Dr. Leonie Man was in the hospital working and not available. Pt stated she is still having tingling, numbness from left side of her body starting from top of her head to the tip of her toes. Pt stated she is having pain in her rib cage and abdomen. Rn stated a message will be sent to Dr. Krista Blue the work in [place] this am. Pt verbalized understanding.PT stated numbness tingling on left side only.PEr pt no right side numbness or tingling this is clarification.

## 2016-01-16 NOTE — Telephone Encounter (Signed)
LFt  vm for patient to pick up prn relpax medication for headaches.Rn left message that's is a prn med, not daily. Also she is only allowed 10 pills per month per insurance guidelines and the med.

## 2016-01-16 NOTE — Telephone Encounter (Signed)
Pt called in stating she has a continuation of numbness and tingling along with right forearm and right hand are swollen. No pain along with it but it is new and swollen. She does not have a headache. She does say she has shooting pain on left side down toward her side and abdomin. She would like a call back. [phone]

## 2016-01-16 NOTE — Telephone Encounter (Signed)
Rn call patient back with Dr. Krista Blue advice. Rn stated per Dr. Krista Blue note her MRI showed no significant abnormality.Rn stated she has normal MRI.Rn stated per Dr. Krista Blue notes the whole left side numbness including her face, arm, leg, it will potential localize the problem to the right side of the brain, the left-sided paresthesia could also be due to her migraine headaches. Rn ask patient if she was taking her topamax as prescribed and relpax. Pt stated she stop taking the medication 5 days ago, because she thought it was causing the issues. Rn advise pt if she is going to stop medication to notify Dr.Sethi. Pt did not state she was having any side effects from the topamax. Pt stated she did not get the relpax because it required approval. Rn stated nothing was receive from the pharmacy about relpax needing a PA. Rn stated the PA will be done. PT ask about her right hand being swollen. Rn advise pt to seek her PCP about her right hand being swollen. Pt verbalized understanding.

## 2016-01-16 NOTE — Telephone Encounter (Signed)
Please call patient, MRI of the brain showed no significant abnormality,  If she has whole left side numbness including her face, arm, leg, it will potential localize the problem to the right side of the brain, the left-sided paresthesia could also be due to her migraine headaches, normal MRI of the brain is reassuring.

## 2016-02-01 ENCOUNTER — Telehealth: Payer: Self-pay

## 2016-02-01 NOTE — Telephone Encounter (Signed)
Janet Fila, MD  Marval Regal, RN        I called and left a message on the patient's answering machine to call me back to discuss results of her recent brain MRI scan

## 2016-02-02 NOTE — Telephone Encounter (Signed)
I called the patient again and again found answering machine and left a message saying MRI scan of the brain did not show anything major finding to worry about but discussion of findings could wait till she keeps her follow-up appointment with me. She was advised to call me earlier if she wanted to talk sooner

## 2016-02-03 ENCOUNTER — Telehealth: Payer: Self-pay | Admitting: *Deleted

## 2016-02-06 NOTE — Telephone Encounter (Signed)
Patient call back about MRI results.

## 2016-02-07 NOTE — Telephone Encounter (Signed)
I called the patient again today for the third time and again found an answering machine and left a message saying that the MRI scan shows some findings which need discussion but it is nothing serious or urgent and can wait till she has a follow-up appointment to see me to discuss this.

## 2016-02-09 ENCOUNTER — Telehealth: Payer: Self-pay | Admitting: Neurology

## 2016-02-09 NOTE — Telephone Encounter (Signed)
She said tested positive for Sjogren diisease and peripheral neuropathy has been associated with that. She advised she has been referred to a rheumatologist. Pt said she is still having tingling, numbness and cramping on the left side of the body.

## 2016-02-09 NOTE — Telephone Encounter (Signed)
Pt returned call to go over MRI. She did get message on machine.

## 2016-02-09 NOTE — Telephone Encounter (Signed)
Message sent to Dr.Sethi for review.

## 2016-02-09 NOTE — Telephone Encounter (Signed)
error 

## 2016-02-09 NOTE — Telephone Encounter (Signed)
Patient returned Dr. Clydene Fake call, "has spotty cell signal at work, will try to call back around 3:00pm".

## 2016-02-09 NOTE — Telephone Encounter (Signed)
Lft vm patient was call again by MD.

## 2016-02-09 NOTE — Telephone Encounter (Signed)
I finally spoke to patient today and informed her about MRI scan of the brain results showing nonspecific white matter hyperintensities likely related to migraines on her recent diagnosis of sjogren`s syndrome. She voiced understanding. I recommend she increase Topamax to 50 mg twice daily to help with her migraines as well as left body paresthesias. She was advised to keep her follow-up appointment with me

## 2016-02-09 NOTE — Telephone Encounter (Signed)
Patient called back. She will wait for Dr Chauncey Cruel to call

## 2016-02-27 ENCOUNTER — Other Ambulatory Visit: Payer: Self-pay | Admitting: Neurology

## 2016-02-27 DIAGNOSIS — R202 Paresthesia of skin: Secondary | ICD-10-CM

## 2016-02-27 DIAGNOSIS — G43009 Migraine without aura, not intractable, without status migrainosus: Secondary | ICD-10-CM

## 2016-03-20 ENCOUNTER — Encounter: Payer: Self-pay | Admitting: Neurology

## 2016-03-20 ENCOUNTER — Ambulatory Visit (INDEPENDENT_AMBULATORY_CARE_PROVIDER_SITE_OTHER): Payer: BLUE CROSS/BLUE SHIELD | Admitting: Neurology

## 2016-03-20 DIAGNOSIS — R202 Paresthesia of skin: Secondary | ICD-10-CM | POA: Diagnosis not present

## 2016-03-20 DIAGNOSIS — G43009 Migraine without aura, not intractable, without status migrainosus: Secondary | ICD-10-CM

## 2016-03-20 MED ORDER — TOPIRAMATE 25 MG PO TABS: 75.0000 mg | ORAL_TABLET | Freq: Two times a day (BID) | ORAL | 3 refills | Status: DC

## 2016-03-20 NOTE — Progress Notes (Signed)
[facility] [address] [place]. [place] [address] [phone]       OFFICE FOLLOW UP VISIT NOTE  [name] Date of Birth:  01/12/1976 Medical Record Number:  [id number]   Referring MD:  Janet Gibson  Reason for Referral:  Headaches and numbness  HPI: Initial Consult 12/27/2015:40 year lady whose had history of migraine headaches starting since her 78s. She has noticed increase in frequency and severity of her headaches in the last few years as well as more recently tingling sensations on the left side. She states she woke up on August 18 with a terrible migraine as well as noticed tingling initially in the left cheek. Later on in the morning the tingling involving left shoulder as well as spread to the left leg. She takes ibuprofen 800 mg and usually it helps but on this occasion it did not help. She went to urgent care. She is started on, pending 103 times daily which she has been taking. However she continued to have tingling and numbness off and on lasting about 10 minutes or so. She also developed a few days later pain in the left shoulder as well as spasms and on her back on the upper chest. This has been happening intermittently. Interestingly she states when she gets back spasms her arm tingling disappears. The gabapentin 100 mg 3 times daily has not been helping her. The cramps seem to rotate locations but mostly in the upper back and left shoulder. She does complain of tightness in the neck muscles. Most of her migraine headaches interestingly are located in the back of the head. She denies any loss of vision, visual scotoma speech problems and dizziness vertigo gait or balance problems. [place] moved [place] ago from [place] this area. She was seen the neurologist at [facility]. She remembers having had a brain scan done in 2009 which was normal. Recently patient was given a prednisone taper pack by her primary physician but that has  not helped either the paresthesia of her headaches. She has tried Imitrex years ago but it did not help her migraine. She currently takes it on Namenda milligrams of ibuprofen. She has also been tried in the past on atenolol as well as amitriptyline for migraine prophylaxis but states it did not help. She has not been on Topamax. She has not tried any of the new triptan medications. She denies any family history of migraines. Denies any significant head injury with loss of consciousness. She does admit to getting fatigued easily but denies any vertigo, diplopia, gait imbalance, bladder urgency or incontinence. Update 03/20/2016 ; She returns for follow-up after last visit 3 months ago. She stated the Relpax seems to work quite good for her. She responds complete resolution of migraines every time she is taken it. She continues to take Topamax 50 mg twice daily and is tolerating it well without any side effects but states that she has not noted any particular increase benefit from increasing the dose from 25 mg twice daily to 50 mg twice daily. She continued to have intermittent paresthesias which are random. They seem to be more in the left leg and left arm or face but can occur on the right side as well. These usually accompany her headaches but can occur without headaches as well. She has been diagnosed with Sjogren syndrome by rheumatologist Dr. Sabino Gibson who started on chloroquine. She did have MRI scan of the brain done on 01/13/16 which I  have personally reviewed shows a few nonspecific white matter hyperintensities but no other major abnormalities. I discussed my findings with her. Patient has no other new complaints today. ROS:   14 system review of systems is positive for  off, ringing in the ears, headache, numbness and all other systems negative  PMH:  Past Medical History:  Diagnosis Date  . Abnormal Pap smear of cervix   . Allergy    seasonal  . Anxiety   . Constipation   . Depression   .  GERD (gastroesophageal reflux disease)   . Headache    migraines  . Ovarian cyst     Social History:  Social History   Social History  . Marital status: Married    Spouse name: N/A  . Number of children: N/A  . Years of education: N/A   Occupational History  . Not on file.   Social History Main Topics  . Smoking status: Never Smoker  . Smokeless tobacco: Never Used  . Alcohol use 2.4 oz/week    3 Standard drinks or equivalent, 1 Glasses of wine per week     Comment: occ.  . Drug use: No  . Sexual activity: Yes    Partners: Male    Birth control/ protection: Pill   Other Topics Concern  . Not on file   Social History Narrative  . No narrative on file    Medications:   Current Outpatient Prescriptions on File Prior to Visit  Medication Sig Dispense Refill  . cetirizine (ZYRTEC) 10 MG tablet Take 10 mg by mouth daily.    Marland Kitchen eletriptan (RELPAX) 20 MG tablet TAKE ONE TABLET BY MOUTH AS NEEDED FOR MIGRAINE OR HEADACHE. MAY REPEAT IN 2 HOURS IF HEADACHE PRESISTS OR RECURS 10 tablet 0  . ibuprofen (ADVIL,MOTRIN) 200 MG tablet Take 200 mg by mouth as needed for moderate pain. Reported on 09/19/2015    . linaclotide (LINZESS) 145 MCG CAPS capsule Take 1 capsule (145 mcg total) by mouth daily before breakfast. 30 capsule 6  . multivitamin-iron-minerals-folic acid (CENTRUM) chewable tablet Chew 1 tablet by mouth daily. Reported on 07/14/2015    . predniSONE (DELTASONE) 10 MG tablet     . pseudoephedrine (SUDAFED) 30 MG tablet Take 30 mg by mouth every 4 (four) hours as needed for congestion.     Current Facility-Administered Medications on File Prior to Visit  Medication Dose Route Frequency Provider Last Rate Last Dose  . gadopentetate dimeglumine (MAGNEVIST) injection 12 mL  12 mL Intravenous Once PRN Garvin Fila, MD        Allergies:  No Known Allergies  Physical Exam General: well developed, well nourished asian young lady, seated, in no evident distress Head: head  normocephalic and atraumatic.   Neck: supple with no carotid or supraclavicular bruits Cardiovascular: regular rate and rhythm, no murmurs Musculoskeletal: no deformity. Mild tightness of posterior cervical muscles. Skin:  no rash/petichiae Vascular:  Normal pulses all extremities  Neurologic Exam Mental Status: Awake and fully alert. Oriented to place and time. Recent and remote memory intact. Attention span, concentration and fund of knowledge appropriate. Mood and affect appropriate.  Cranial Nerves: Fundoscopic exam reveals sharp disc margins. Pupils equal, briskly reactive to light. Extraocular movements full without nystagmus. Visual fields full to confrontation. Hearing intact. Facial sensation intact. Face, tongue, palate moves normally and symmetrically.  Motor: Normal bulk and tone. Normal strength in all tested extremity muscles. Sensory.: intact to touch , pinprick , position and vibratory sensation.  Coordination:  Rapid alternating movements normal in all extremities. Finger-to-nose and heel-to-shin performed accurately bilaterally. Gait and Station: Arises from chair without difficulty. Stance is normal. Gait demonstrates normal stride length and balance . Able to heel, toe and tandem walk without difficulty.  Reflexes: 1+ and symmetric. Toes downgoing.       ASSESSMENT: 3 year lady with long-standing history of migraine headaches with recent increase in  frequency, severity as well as intermittent left body paresthesias of unclear etiology. Headaches appear to have responded to Topamax but paresthesias continue. Recent diagnosis of Sjogren`s syndrome    PLAN: I had a long discussion the patient with regards to migraine headaches as well as paresthesias and recommend he try increasing the dose of Topamax to 75 mg twice daily if tolerated. If she has side effects  or no benefit noted we may consider switching to Lyrica or gabapentin in the future. Continue Relpax for  symptomatic relief of her headaches. Greater than 50% time during this 25 minute visit was spent on counseling and coordination of care about her migraines and paresthesias and answering questions She will return for follow-up in 3 months with Ward Givens, nurse practitioner call earlier if necessary Antony Contras, MD  [facility] [address] [place], [place] [address]  Phone [phone] Fax [phone] Note: This document was prepared with digital dictation and possible smart phrase technology. Any transcriptional errors that result from this process are unintentional.

## 2016-05-25 ENCOUNTER — Telehealth: Payer: Self-pay

## 2016-05-25 DIAGNOSIS — G43009 Migraine without aura, not intractable, without status migrainosus: Secondary | ICD-10-CM

## 2016-05-25 DIAGNOSIS — R202 Paresthesia of skin: Secondary | ICD-10-CM

## 2016-05-25 MED ORDER — TOPIRAMATE 25 MG PO TABS: 75.0000 mg | ORAL_TABLET | Freq: Two times a day (BID) | ORAL | 4 refills | Status: DC

## 2016-05-25 NOTE — Telephone Encounter (Signed)
Refill for topamax was done this morning. Pt needs to call the pharmacy.

## 2016-05-25 NOTE — Telephone Encounter (Signed)
Pt called said she has 1 tab left for tonight. FYI

## 2016-06-20 ENCOUNTER — Ambulatory Visit: Payer: BLUE CROSS/BLUE SHIELD | Admitting: Adult Health

## 2016-07-18 ENCOUNTER — Other Ambulatory Visit: Payer: Self-pay | Admitting: Obstetrics & Gynecology

## 2016-07-18 ENCOUNTER — Other Ambulatory Visit: Payer: Self-pay | Admitting: Internal Medicine

## 2016-07-18 DIAGNOSIS — Z1231 Encounter for screening mammogram for malignant neoplasm of breast: Secondary | ICD-10-CM

## 2016-07-30 ENCOUNTER — Ambulatory Visit: Payer: BLUE CROSS/BLUE SHIELD | Admitting: Adult Health

## 2016-07-31 ENCOUNTER — Other Ambulatory Visit: Payer: Self-pay | Admitting: Neurology

## 2016-07-31 DIAGNOSIS — R202 Paresthesia of skin: Secondary | ICD-10-CM

## 2016-07-31 DIAGNOSIS — G43009 Migraine without aura, not intractable, without status migrainosus: Secondary | ICD-10-CM

## 2016-08-02 ENCOUNTER — Ambulatory Visit: Payer: BLUE CROSS/BLUE SHIELD

## 2016-08-21 ENCOUNTER — Ambulatory Visit
Admission: RE | Admit: 2016-08-21 | Discharge: 2016-08-21 | Disposition: A | Discharge status: Home / Self Care | Payer: BLUE CROSS/BLUE SHIELD | Source: Ambulatory Visit | Attending: Obstetrics & Gynecology | Admitting: Obstetrics & Gynecology

## 2016-08-21 DIAGNOSIS — Z1231 Encounter for screening mammogram for malignant neoplasm of breast: Secondary | ICD-10-CM

## 2016-09-20 ENCOUNTER — Telehealth: Payer: Self-pay | Admitting: Neurology

## 2016-09-20 NOTE — Telephone Encounter (Signed)
Patient called office in reference to topiramate (TOPAMAX) 25 MG tablet.  Patient states she has continuing tingling, numbness and cramping from waist almost to her toes.  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  Patient would like to know if topiramate (TOPAMAX) 25 MG tablet is the best opinion to help with symptoms or if another medication will be better.  Please call

## 2016-09-20 NOTE — Telephone Encounter (Signed)
Called patient in reference to scheduling an appointment with NP or MD per nurse Katrina. Leave message on voice mail for patient to call office to schedule.

## 2016-09-20 NOTE — Telephone Encounter (Signed)
If patient calls back she needs to schedule appt with Dr. Leonie Man or Jinny Blossom NP whoever has availability first.Pt last seen 03/2016.

## 2016-09-20 NOTE — Telephone Encounter (Signed)
IF patient calls back she needs an appt with the NP or MD.She was last seen 03/2016. PT has cancel last two appts. This numbness, tingling she has had is chronic. Pt needs an appt. Thanks

## 2017-01-07 ENCOUNTER — Telehealth: Payer: Self-pay | Admitting: General Practice

## 2017-01-07 NOTE — Telephone Encounter (Signed)
Patient called and left message stating she was referred to dermatology 8 months ago, maybe a little while longer than that and wasn't sure if her referral was still valid. Patient is requesting a call back. Per chart review, referral was placed March 2017. Patient was also encouraged to see PCP at that time. Called patient stating I am returning her phone call. Patient states she had a dermatology appt but couldn't make her appts and they just kept getting rescheduled. Told patient at this point I doubted the referral was still valid but she could follow up with her PCP and then they could refer her later if they felt it wasn't something they could take care of. Patient verbalized understanding to all & had no questions

## 2017-08-23 ENCOUNTER — Telehealth: Payer: Self-pay | Admitting: Neurology

## 2017-08-23 DIAGNOSIS — G43009 Migraine without aura, not intractable, without status migrainosus: Secondary | ICD-10-CM

## 2017-08-23 DIAGNOSIS — R202 Paresthesia of skin: Secondary | ICD-10-CM

## 2017-08-26 ENCOUNTER — Other Ambulatory Visit: Payer: Self-pay

## 2017-08-26 DIAGNOSIS — G43009 Migraine without aura, not intractable, without status migrainosus: Secondary | ICD-10-CM

## 2017-08-26 DIAGNOSIS — R202 Paresthesia of skin: Secondary | ICD-10-CM

## 2017-08-26 MED ORDER — TOPIRAMATE 25 MG PO TABS: 75.0000 mg | ORAL_TABLET | Freq: Two times a day (BID) | ORAL | 1 refills | Status: DC

## 2017-08-26 NOTE — Telephone Encounter (Signed)
Left vm for patient that per DR. SEthi he can give her only 2 month of topamax. Being that she was last seen 2017 he can only give her 2 month refill to 10/16/2017 appt.He recommend pt get a earlier appt with NP if feasible. Rn also left message that if she does not come on 10/16/3017 no more refills can be given.

## 2017-08-26 NOTE — Telephone Encounter (Signed)
Patient has appointment with Dr. Leonie Man on 10-16-17 and wants to know if  topiramate (TOPAMAX) 25 MG tablet can be called to [facility] in [place] to last until her appointment.

## 2017-08-26 NOTE — Telephone Encounter (Addendum)
Rn stated she was last seen 03/2016. Rn stated per Dr .Leonie Man can she contact her PCP, or urgent care for refills on medication.  RN stated per Dr. Leonie Man she was seen 2017.Pt stated her PCP has not treated her for this issue or prescribed this medication. She also stated that her job needs timely notice to get off for work. Rn stated a message will be sent to Dr.SEthi. Rn also recommend she seek her PCP or urgent care. Pt verbalized understanding.

## 2017-08-26 NOTE — Telephone Encounter (Signed)
Rn spoke with Dr.Sethi. Rn stated pt has not seen her PCP for headaches or this medication. This is per patient. PEr Dr. Leonie Man pt needs an earlier appt, or we can give her 2 month rx till appt on 10/16/2017. Pt was last seen 2017. Refill only given for 2 months.

## 2017-08-27 NOTE — Telephone Encounter (Signed)
Left vm for patient to call back about topamax refill only for 2 months,and keeping her appt or being seen earlier.

## 2017-08-27 NOTE — Telephone Encounter (Signed)
Pt returning RNs call stating that if possible she would prefer to have Dr. Leonie Man to refill until upcoming appt. Stating its difficult for her to get off work. Pt requesting refills be faxed to [facility].

## 2017-08-27 NOTE — Telephone Encounter (Addendum)
Refill sent to pharmacy listed. Pt will be sent a letter reminding her if she does not come to appt  no more refills will be administered  after 10/16/2017.Pts appt is 10/16/2017. PT last seen 2017.

## 2017-08-29 NOTE — Telephone Encounter (Signed)
Revised.

## 2017-08-29 NOTE — Telephone Encounter (Signed)
Pt sent a letter by Dr. Leonie Man stating no more refills will be administered 10/16/2017 if she does not come to appt. Pt last sen 03/2016.

## 2017-10-15 NOTE — Progress Notes (Signed)
[facility] [address] [place]. [place] [address] [phone]       OFFICE FOLLOW UP VISIT NOTE  [name] Date of Birth:  05/25/1975 Medical Record Number:  [id number]   Referring MD:  Emeterio Reeve  Reason for Referral:  Headaches and numbness  HPI: Initial Consult 12/27/2015: 58 year lady whose had history of migraine headaches starting since her 69s. She has noticed increase in frequency and severity of her headaches in the last few years as well as more recently tingling sensations on the left side. She states she woke up on August 18 with a terrible migraine as well as noticed tingling initially in the left cheek. Later on in the morning the tingling involving left shoulder as well as spread to the left leg. She takes ibuprofen 800 mg and usually it helps but on this occasion it did not help. She went to urgent care. She is started on, pending 103 times daily which she has been taking. However she continued to have tingling and numbness off and on lasting about 10 minutes or so. She also developed a few days later pain in the left shoulder as well as spasms and on her back on the upper chest. This has been happening intermittently. Interestingly she states when she gets back spasms her arm tingling disappears. The gabapentin 100 mg 3 times daily has not been helping her. The cramps seem to rotate locations but mostly in the upper back and left shoulder. She does complain of tightness in the neck muscles. Most of her migraine headaches interestingly are located in the back of the head. She denies any loss of vision, visual scotoma speech problems and dizziness vertigo gait or balance problems. She moved a [place] ago from [place] this area. She was seen the neurologist at [facility]. She remembers having had a brain scan done in 2009 which was normal. Recently patient was given a prednisone taper pack by her primary physician but that  has not helped either the paresthesia of her headaches. She has tried Imitrex years ago but it did not help her migraine. She currently takes it on Namenda milligrams of ibuprofen. She has also been tried in the past on atenolol as well as amitriptyline for migraine prophylaxis but states it did not help. She has not been on Topamax. She has not tried any of the new triptan medications. She denies any family history of migraines. Denies any significant head injury with loss of consciousness. She does admit to getting fatigued easily but denies any vertigo, diplopia, gait imbalance, bladder urgency or incontinence.  03/20/2016 visit PS: She returns for follow-up after last visit 3 months ago. She stated the Relpax seems to work quite good for her. She responds complete resolution of migraines every time she is taken it. She continues to take Topamax 50 mg twice daily and is tolerating it well without any side effects but states that she has not noted any particular increase benefit from increasing the dose from 25 mg twice daily to 50 mg twice daily. She continued to have intermittent paresthesias which are random. They seem to be more in the left leg and left arm or face but can occur on the right side as well. These usually accompany her headaches but can occur without headaches as well. She has been diagnosed with Sjogren syndrome by rheumatologist Dr. Sabino Niemann who started on chloroquine. She did have MRI scan of the brain done on 01/13/16  which I have personally reviewed shows a few nonspecific white matter hyperintensities but no other major abnormalities. I discussed my findings with her. Patient has no other new complaints today.  10/16/17 UPDATE: Patient is being seen today for one-year follow-up for migraines.  She is currently taking Topamax 75 mg twice daily but has been controlling migraines well and she states she may have one every other month.  She states he is can start as being debilitating but  will take Relpax and this does help decrease the amount of pain and in order to be functional.  She continues to work full-time and continues to do all previous activities.  Denies further concerns at this time.   ROS:   14 system review of systems is positive for headache and all other systems negative  PMH:  Past Medical History:  Diagnosis Date  . Abnormal Pap smear of cervix   . Allergy    seasonal  . Anxiety   . Constipation   . Depression   . GERD (gastroesophageal reflux disease)   . Headache    migraines  . Ovarian cyst     Social History:  Social History   Socioeconomic History  . Marital status: Married    Spouse name: Not on file  . Number of children: Not on file  . Years of education: Not on file  . Highest education level: Not on file  Occupational History  . Not on file  Social Needs  . Financial resource strain: Not on file  . Food insecurity:    Worry: Not on file    Inability: Not on file  . Transportation needs:    Medical: Not on file    Non-medical: Not on file  Tobacco Use  . Smoking status: Never Smoker  . Smokeless tobacco: Never Used  Substance and Sexual Activity  . Alcohol use: Yes    Alcohol/week: 2.4 oz    Types: 3 Standard drinks or equivalent, 1 Glasses of wine per week    Comment: occ.  . Drug use: No  . Sexual activity: Yes    Partners: Male    Birth control/protection: Pill  Lifestyle  . Physical activity:    Days per week: Not on file    Minutes per session: Not on file  . Stress: Not on file  Relationships  . Social connections:    Talks on phone: Not on file    Gets together: Not on file    Attends religious service: Not on file    Active member of club or organization: Not on file    Attends meetings of clubs or organizations: Not on file    Relationship status: Not on file  . Intimate partner violence:    Fear of current or ex partner: Not on file    Emotionally abused: Not on file    Physically abused: Not on  file    Forced sexual activity: Not on file  Other Topics Concern  . Not on file  Social History Narrative  . Not on file    Medications:   Current Outpatient Medications on File Prior to Visit  Medication Sig Dispense Refill  . cetirizine (ZYRTEC) 10 MG tablet Take 10 mg by mouth daily.    Marland Kitchen eletriptan (RELPAX) 20 MG tablet TAKE ONE TABLET BY MOUTH AS NEEDED FOR MIGRAINE OR HEADACHE. MAY REPEAT IN 2 HOURS IF HEADACHE PRESISTS OR RECURS 10 tablet 3  . ibuprofen (ADVIL,MOTRIN) 200 MG tablet Take 200 mg by  mouth as needed for moderate pain. Reported on 09/19/2015    . linaclotide (LINZESS) 145 MCG CAPS capsule Take 1 capsule (145 mcg total) by mouth daily before breakfast. 30 capsule 6  . multivitamin-iron-minerals-folic acid (CENTRUM) chewable tablet Chew 1 tablet by mouth daily. Reported on 07/14/2015    . predniSONE (DELTASONE) 10 MG tablet     . pseudoephedrine (SUDAFED) 30 MG tablet Take 30 mg by mouth every 4 (four) hours as needed for congestion.    . topiramate (TOPAMAX) 25 MG tablet Take 3 tablets (75 mg total) by mouth 2 (two) times daily. 180 tablet 1   Current Facility-Administered Medications on File Prior to Visit  Medication Dose Route Frequency Provider Last Rate Last Dose  . gadopentetate dimeglumine (MAGNEVIST) injection 12 mL  12 mL Intravenous Once PRN Garvin Fila, MD        Allergies:  No Known Allergies  Physical Exam General: well developed, well nourished pleasant Asian young lady, seated, in no evident distress Head: head normocephalic and atraumatic.   Neck: supple with no carotid or supraclavicular bruits Cardiovascular: regular rate and rhythm, no murmurs Musculoskeletal: no deformity.  Skin:  no rash/petichiae Vascular:  Normal pulses all extremities  Neurologic Exam Mental Status: Awake and fully alert. Oriented to place and time. Recent and remote memory intact. Attention span, concentration and fund of knowledge appropriate. Mood and affect  appropriate.  Cranial Nerves: Fundoscopic exam reveals sharp disc margins. Pupils equal, briskly reactive to light. Extraocular movements full without nystagmus. Visual fields full to confrontation. Hearing intact. Facial sensation intact. Face, tongue, palate moves normally and symmetrically.  Motor: Normal bulk and tone. Normal strength in all tested extremity muscles. Sensory.: intact to touch , pinprick , position and vibratory sensation.  Coordination: Rapid alternating movements normal in all extremities. Finger-to-nose and heel-to-shin performed accurately bilaterally. Gait and Station: Arises from chair without difficulty. Stance is normal. Gait demonstrates normal stride length and balance . Able to heel, toe and tandem walk without difficulty.  Reflexes: 1+ and symmetric. Toes downgoing.       ASSESSMENT: 68 year lady with long-standing history of migraine headaches with recent increase in  frequency, severity as well as intermittent left body paresthesias of unclear etiology. Headaches appear to have responded to Topamax but paresthesias continue. Recent diagnosis of Sjogren`s syndrome.  Patient returns today for one-year follow-up for migraines.    PLAN:  Continue topamax 18m twice a day Continue relpax 213mas needed Both above medications refilled and sent to preferred pharmacy Advised to continue to stay active and eat healthy  Patient will follow up in 1 year or call earlier if needed  Greater than 50% of time during this 25 minute visit was spent on counseling,explanation of diagnosis of migraines, planning of further management, discussion with patient and family and coordination of care  JeVenancio PoissonAGNP-BC  [facility] [address] [id number] [phone] [phone]: This document was prepared with digital dictation and possible smart phrase technology. Any transcriptional errors that  result from this process are unintentional.

## 2017-10-16 ENCOUNTER — Encounter: Payer: Self-pay | Admitting: Adult Health

## 2017-10-16 ENCOUNTER — Ambulatory Visit: Payer: BLUE CROSS/BLUE SHIELD | Admitting: Adult Health

## 2017-10-16 DIAGNOSIS — G43009 Migraine without aura, not intractable, without status migrainosus: Secondary | ICD-10-CM

## 2017-10-16 MED ORDER — ELETRIPTAN HYDROBROMIDE 20 MG PO TABS: ORAL_TABLET | ORAL | 3 refills | Status: DC

## 2017-10-16 MED ORDER — TOPIRAMATE 25 MG PO TABS: 75.0000 mg | ORAL_TABLET | Freq: Two times a day (BID) | ORAL | 4 refills | Status: DC

## 2017-10-16 NOTE — Telephone Encounter (Signed)
PT cancel appts on 3/72018,and 07/30/2016 at [facility]. See letter sent to patient from Dr. Leonie Man.

## 2017-10-16 NOTE — Patient Instructions (Addendum)
Your Plan:  Continue Topamax 70m twice a day for continuous control   Continue Relpax 259mas needed for emergent relief     Follow up in 1 year or call earlier if needed       Thank you for [facility]. I hope we have been able to provide you high quality care today.  You may receive a patient satisfaction survey over the next few weeks. We would appreciate your feedback and comments so that we may continue to improve ourselves and the health of our patients.

## 2017-10-21 NOTE — Progress Notes (Signed)
I agree with the above plan 

## 2018-06-03 ENCOUNTER — Other Ambulatory Visit: Payer: Self-pay

## 2018-06-03 DIAGNOSIS — I83891 Varicose veins of right lower extremities with other complications: Secondary | ICD-10-CM

## 2018-06-04 ENCOUNTER — Encounter (HOSPITAL_COMMUNITY): Payer: BLUE CROSS/BLUE SHIELD

## 2018-06-04 ENCOUNTER — Encounter: Payer: BLUE CROSS/BLUE SHIELD | Admitting: Vascular Surgery

## 2018-06-04 ENCOUNTER — Encounter: Payer: Self-pay | Admitting: Family

## 2018-08-07 ENCOUNTER — Encounter (HOSPITAL_COMMUNITY): Payer: BLUE CROSS/BLUE SHIELD

## 2018-08-07 ENCOUNTER — Encounter: Payer: BLUE CROSS/BLUE SHIELD | Admitting: Vascular Surgery

## 2018-10-15 ENCOUNTER — Other Ambulatory Visit: Payer: Self-pay

## 2018-10-15 ENCOUNTER — Encounter: Payer: Self-pay | Admitting: Vascular Surgery

## 2018-10-15 ENCOUNTER — Ambulatory Visit (HOSPITAL_COMMUNITY)
Admission: RE | Admit: 2018-10-15 | Discharge: 2018-10-15 | Disposition: A | Discharge status: Home / Self Care | Payer: BC Managed Care – PPO | Source: Ambulatory Visit | Attending: Vascular Surgery | Admitting: Vascular Surgery

## 2018-10-15 ENCOUNTER — Ambulatory Visit (INDEPENDENT_AMBULATORY_CARE_PROVIDER_SITE_OTHER): Payer: BC Managed Care – PPO | Admitting: Vascular Surgery

## 2018-10-15 VITALS — BP 125/88 | HR 86 | Temp 97.5°F | Resp 18 | Ht 65.0 in | Wt 128.0 lb

## 2018-10-15 DIAGNOSIS — I83891 Varicose veins of right lower extremities with other complications: Secondary | ICD-10-CM | POA: Diagnosis not present

## 2018-10-15 DIAGNOSIS — I83811 Varicose veins of right lower extremities with pain: Secondary | ICD-10-CM | POA: Diagnosis not present

## 2018-10-15 NOTE — Progress Notes (Signed)
REASON FOR CONSULT:    Painful varicose veins right lower extremity.  The patient is self-referred.  ASSESSMENT & PLAN:   PAINFUL VARICOSE VEINS RIGHT LOWER EXTREMITY: This patient has some dilated reticular veins in her right leg which are symptomatic.  These could potentially be addressed with sclerotherapy however I think she would be at increased risk of recurrence given her reflux in the right great saphenous vein.  The vein currently is not especially dilated.  We have discussed conservative treatment including the importance of intermittent leg elevation and the proper positioning for this.  I have encouraged her to do this daily.  I encouraged her to continue wearing her compression stockings especially when at work.  We have discussed the importance of exercise specifically walking and water aerobics.  I have encouraged her to avoid prolonged sitting and standing.  If her varicose veins and symptoms progress in the future we could reevaluate her.  If the great saphenous vein continues to enlarge she could potentially be a candidate for laser ablation and subsequent sclerotherapy.  She will call if her symptoms progress.  [name], MD, FACS Beeper [phone] Office: [phone]  HPI:   Janet Gibson is a pleasant 43 y.o. female, who presents for evaluation of painful varicose veins of the right lower extremity.  She has had varicose veins for several years in the right leg.  She describes some aching pain and heaviness which is aggravated by sitting and standing.  She is on her feet most of her day at work for long hours.  She does wear compression stockings which clearly helped her symptoms.  She also elevates her legs some.  She has not required ibuprofen.  She denies any previous history of DVT or phlebitis.  She is otherwise very healthy.  Past Medical History:  Diagnosis Date  . Abnormal Pap smear of cervix   . Allergy    seasonal  . Anxiety   . Constipation   .  Depression   . GERD (gastroesophageal reflux disease)   . Headache    migraines  . Ovarian cyst     Family History  Problem Relation Age of Onset  . Heart disease Mother   . Hypertension Mother   . Stroke Mother   . Heart disease Father   . Hypertension Father   . Stroke Father   . Colon cancer Paternal Grandfather   . Colitis Paternal Grandmother     SOCIAL HISTORY: Social History   Socioeconomic History  . Marital status: Married    Spouse name: Not on file  . Number of children: Not on file  . Years of education: Not on file  . Highest education level: Not on file  Occupational History  . Not on file  Social Needs  . Financial resource strain: Not on file  . Food insecurity    Worry: Not on file    Inability: Not on file  . Transportation needs    Medical: Not on file    Non-medical: Not on file  Tobacco Use  . Smoking status: Never Smoker  . Smokeless tobacco: Never Used  Substance and Sexual Activity  . Alcohol use: Yes    Alcohol/week: 4.0 standard drinks    Types: 1 Glasses of wine, 3 Standard drinks or equivalent per week    Comment: occ.  . Drug use: No  . Sexual activity: Yes    Partners: Male    Birth control/protection: Pill  Lifestyle  . Physical activity  Days per week: Not on file    Minutes per session: Not on file  . Stress: Not on file  Relationships  . Social Herbalist on phone: Not on file    Gets together: Not on file    Attends religious service: Not on file    Active member of club or organization: Not on file    Attends meetings of clubs or organizations: Not on file    Relationship status: Not on file  . Intimate partner violence    Fear of current or ex partner: Not on file    Emotionally abused: Not on file    Physically abused: Not on file    Forced sexual activity: Not on file  Other Topics Concern  . Not on file  Social History Narrative  . Not on file    No Known Allergies  Current Outpatient  Medications  Medication Sig Dispense Refill  . cetirizine (ZYRTEC) 10 MG tablet Take 10 mg by mouth daily.    Marland Kitchen docusate sodium (COLACE) 100 MG capsule Take 100 mg by mouth 2 (two) times daily.    Marland Kitchen eletriptan (RELPAX) 20 MG tablet TAKE ONE TABLET BY MOUTH AS NEEDED FOR MIGRAINE OR HEADACHE. MAY REPEAT IN 2 HOURS IF HEADACHE PRESISTS OR RECURS 10 tablet 3  . ibuprofen (ADVIL,MOTRIN) 200 MG tablet Take 200 mg by mouth as needed for moderate pain. Reported on 09/19/2015    . multivitamin-iron-minerals-folic acid (CENTRUM) chewable tablet Chew 1 tablet by mouth daily. Reported on 07/14/2015    . pseudoephedrine (SUDAFED) 30 MG tablet Take 30 mg by mouth every 4 (four) hours as needed for congestion.    . topiramate (TOPAMAX) 25 MG tablet Take 3 tablets (75 mg total) by mouth 2 (two) times daily. 540 tablet 4   No current facility-administered medications for this visit.    Facility-Administered Medications Ordered in Other Visits  Medication Dose Route Frequency Provider Last Rate Last Dose  . gadopentetate dimeglumine (MAGNEVIST) injection 12 mL  12 mL Intravenous Once PRN Garvin Fila, MD        REVIEW OF SYSTEMS:  [X]  denotes positive finding, [ ]  denotes negative finding Cardiac  Comments:  Chest pain or chest pressure:    Shortness of breath upon exertion:    Short of breath when lying flat:    Irregular heart rhythm:        Vascular    Pain in calf, thigh, or hip brought on by ambulation:    Pain in feet at night that wakes you up from your sleep:     Blood clot in your veins:    Leg swelling:         Pulmonary    Oxygen at home:    Productive cough:     Wheezing:         Neurologic    Sudden weakness in arms or legs:     Sudden numbness in arms or legs:     Sudden onset of difficulty speaking or slurred speech:    Temporary loss of vision in one eye:     Problems with dizziness:         Gastrointestinal    Blood in stool:     Vomited blood:         Genitourinary     Burning when urinating:     Blood in urine:        Psychiatric    Major depression:  Hematologic    Bleeding problems:    Problems with blood clotting too easily:        Skin    Rashes or ulcers:        Constitutional    Fever or chills:     PHYSICAL EXAM:   Vitals:   10/15/18 1347  BP: 125/88  Pulse: 86  Resp: 18  Temp: (!) 97.5 F (36.4 C)  TempSrc: Temporal  SpO2: 100%  Weight: 128 lb (58.1 kg)  Height: 5' 5"  (1.651 m)    GENERAL: The patient is a well-nourished female, in no acute distress. The vital signs are documented above. CARDIAC: There is a regular rate and rhythm.  VASCULAR: I do not detect carotid bruits. She has palpable pedal pulses bilaterally. She has some dilated reticular veins in the medial and lateral thigh and calf on the right.  I do not see any large truncal varicosities.  She does not have significant leg swelling.  She does not have hyperpigmentation.      PULMONARY: There is good air exchange bilaterally without wheezing or rales. ABDOMEN: Soft and non-tender with normal pitched bowel sounds.  MUSCULOSKELETAL: There are no major deformities or cyanosis. NEUROLOGIC: No focal weakness or paresthesias are detected. SKIN: There are no ulcers or rashes noted. PSYCHIATRIC: The patient has a normal affect.  DATA:    VENOUS DUPLEX: I have independently interpreted her venous duplex scan today.  On the right lower extremity there is no evidence of DVT.  There is no deep venous reflux.  There is superficial venous reflux from the proximal calf to the saphenofemoral junction.  The diameters of the saphenous vein range from 0.4 at the knee to 0.48 in the distal thigh.  Of note the great saphenous vein is noted to be somewhat tortuous.

## 2018-10-20 ENCOUNTER — Ambulatory Visit: Payer: BLUE CROSS/BLUE SHIELD | Admitting: Adult Health

## 2018-11-20 ENCOUNTER — Other Ambulatory Visit: Payer: Self-pay | Admitting: Adult Health

## 2018-11-20 DIAGNOSIS — G43009 Migraine without aura, not intractable, without status migrainosus: Secondary | ICD-10-CM

## 2019-04-07 ENCOUNTER — Ambulatory Visit: Payer: BC Managed Care – PPO | Attending: Obstetrics & Gynecology

## 2019-04-07 ENCOUNTER — Other Ambulatory Visit: Payer: Self-pay

## 2019-04-07 DIAGNOSIS — Z20822 Contact with and (suspected) exposure to covid-19: Secondary | ICD-10-CM

## 2019-04-08 LAB — NOVEL CORONAVIRUS, NAA: SARS-CoV-2, NAA: NOT DETECTED

## 2020-01-18 ENCOUNTER — Ambulatory Visit: Payer: BLUE CROSS/BLUE SHIELD | Admitting: Adult Health

## 2020-01-18 ENCOUNTER — Encounter: Payer: Self-pay | Admitting: Adult Health

## 2020-01-18 DIAGNOSIS — G43009 Migraine without aura, not intractable, without status migrainosus: Secondary | ICD-10-CM

## 2020-01-18 MED ORDER — ELETRIPTAN HYDROBROMIDE 20 MG PO TABS: ORAL_TABLET | ORAL | 3 refills | Status: DC

## 2020-01-18 MED ORDER — TOPIRAMATE 25 MG PO TABS
75.0000 mg | ORAL_TABLET | Freq: Two times a day (BID) | ORAL | 3 refills | Status: DC
Start: 2020-01-18 — End: 2021-01-19

## 2020-01-18 NOTE — Progress Notes (Signed)
[facility] [address] [place]. [place] [address] [phone]       OFFICE FOLLOW UP VISIT NOTE  Ms. Mariaeduarda Defranco Date of Birth:  Aug 16, 1975 Medical Record Number:  [id number]   Referring MD:  Emeterio Reeve  Reason for Referral:  Headaches and numbness   Chief Complaint  Patient presents with  . Follow-up    tx rm, alone, pt states migraines are well managed       HPI:   Today, 01/18/2020, Ms. Fugere is being seen for migraine follow-up with prior visit 10/2017.  Migraines have been stable since prior visit with ongoing use of Topamax 75 mg twice daily tolerating well without side effects.  Reports migraine occurrence once every 2 months.  She will use Relpax with migraine onset which will help decrease severity and usually prevent migraine pain.  Continued intermittent paresthesias random but very seldom. She remains active and working without difficulty.  no concerns at this time.    History provided for reference purposes only 10/16/17 UPDATE JM: Patient is being seen today for one-year follow-up for migraines.  She is currently taking Topamax 75 mg twice daily but has been controlling migraines well and she states she may have one every other month.  She states he is can start as being debilitating but will take Relpax and this does help decrease the amount of pain and in order to be functional.  She continues to work full-time and continues to do all previous activities.  Denies further concerns at this time.  03/20/2016 visit PS: She returns for follow-up after last visit 3 months ago. She stated the Relpax seems to work quite good for her. She responds complete resolution of migraines every time she is taken it. She continues to take Topamax 50 mg twice daily and is tolerating it well without any side effects but states that she has not noted any particular increase benefit from increasing the dose from 25 mg twice daily to 50 mg twice daily. She  continued to have intermittent paresthesias which are random. They seem to be more in the left leg and left arm or face but can occur on the right side as well. These usually accompany her headaches but can occur without headaches as well. She has been diagnosed with Sjogren syndrome by rheumatologist Dr. Sabino Niemann who started on chloroquine. She did have MRI scan of the brain done on 01/13/16 which I have personally reviewed shows a few nonspecific white matter hyperintensities but no other major abnormalities. I discussed my findings with her. Patient has no other new complaints today.  Initial Consult 12/27/2015 Dr. Leonie Man: 31 year lady whose had history of migraine headaches starting since her 85s. She has noticed increase in frequency and severity of her headaches in the last few years as well as more recently tingling sensations on the left side. She states she woke up on August 18 with a terrible migraine as well as noticed tingling initially in the left cheek. Later on in the morning the tingling involving left shoulder as well as spread to the left leg. She takes ibuprofen 800 mg and usually it helps but on this occasion it did not help. She went to urgent care. She is started on, pending 103 times daily which she has been taking. However she continued to have tingling and numbness off and on lasting about 10 minutes or so. She also developed a few days later pain in the left shoulder as well as spasms and  on her back on the upper chest. This has been happening intermittently. Interestingly she states when she gets back spasms her arm tingling disappears. The gabapentin 100 mg 3 times daily has not been helping her. The cramps seem to rotate locations but mostly in the upper back and left shoulder. She does complain of tightness in the neck muscles. Most of her migraine headaches interestingly are located in the back of the head. She denies any loss of vision, visual scotoma speech problems and dizziness  vertigo gait or balance problems. [place]. She was seen the neurologist at [facility]. She remembers having had a brain scan done in 2009 which was normal. Recently patient was given a prednisone taper pack by her primary physician but that has not helped either the paresthesia of her headaches. She has tried Imitrex years ago but it did not help her migraine. She currently takes it on Namenda milligrams of ibuprofen. She has also been tried in the past on atenolol as well as amitriptyline for migraine prophylaxis but states it did not help. She has not been on Topamax. She has not tried any of the new triptan medications. She denies any family history of migraines. Denies any significant head injury with loss of consciousness. She does admit to getting fatigued easily but denies any vertigo, diplopia, gait imbalance, bladder urgency or incontinence.     ROS:   14 system review of systems is positive for headache and all other systems negative  PMH:  Past Medical History:  Diagnosis Date  . Abnormal Pap smear of cervix   . Allergy    seasonal  . Anxiety   . Constipation   . Depression   . GERD (gastroesophageal reflux disease)   . Headache    migraines  . Ovarian cyst     Social History:  Social History   Socioeconomic History  . Marital status: Married    Spouse name: Not on file  . Number of children: Not on file  . Years of education: Not on file  . Highest education level: Not on file  Occupational History  . Not on file  Tobacco Use  . Smoking status: Never Smoker  . Smokeless tobacco: Never Used  Vaping Use  . Vaping Use: Never used  Substance and Sexual Activity  . Alcohol use: Yes    Alcohol/week: 4.0 standard drinks    Types: 1 Glasses of wine, 3 Standard drinks or equivalent per week    Comment: occ.  . Drug use: No  . Sexual activity: Yes    Partners: Male    Birth control/protection: Pill    Other Topics Concern  . Not on file  Social History Narrative  . Not on file   Social Determinants of Health   Financial Resource Strain:   . Difficulty of Paying Living Expenses: Not on file  Food Insecurity:   . Worried About Charity fundraiser in the Last Year: Not on file  . Ran Out of Food in the Last Year: Not on file  Transportation Needs:   . Lack of Transportation (Medical): Not on file  . Lack of Transportation (Non-Medical): Not on file  Physical Activity:   . Days of Exercise per Week: Not on file  . Minutes of Exercise per Session: Not on file  Stress:   . Feeling of Stress : Not on file  Social Connections:   . Frequency of Communication  with Friends and Family: Not on file  . Frequency of Social Gatherings with Friends and Family: Not on file  . Attends Religious Services: Not on file  . Active Member of Clubs or Organizations: Not on file  . Attends Archivist Meetings: Not on file  . Marital Status: Not on file  Intimate Partner Violence:   . Fear of Current or Ex-Partner: Not on file  . Emotionally Abused: Not on file  . Physically Abused: Not on file  . Sexually Abused: Not on file    Medications:   Current Outpatient Medications on File Prior to Visit  Medication Sig Dispense Refill  . cetirizine (ZYRTEC) 10 MG tablet Take 10 mg by mouth daily.    Marland Kitchen docusate sodium (COLACE) 100 MG capsule Take 100 mg by mouth 2 (two) times daily.    Marland Kitchen eletriptan (RELPAX) 20 MG tablet TAKE ONE TABLET BY MOUTH AS NEEDED FOR MIGRAINE OR HEADACHE. MAY REPEAT IN 2 HOURS IF HEADACHE PRESISTS OR RECURS 10 tablet 3  . ibuprofen (ADVIL,MOTRIN) 200 MG tablet Take 200 mg by mouth as needed for moderate pain. Reported on 09/19/2015    . multivitamin-iron-minerals-folic acid (CENTRUM) chewable tablet Chew 1 tablet by mouth daily. Reported on 07/14/2015    . pseudoephedrine (SUDAFED) 30 MG tablet Take 30 mg by mouth every 4 (four) hours as needed for congestion.    .  topiramate (TOPAMAX) 25 MG tablet Take 3 tablets (75 mg total) by mouth 2 (two) times daily. 540 tablet 4   Current Facility-Administered Medications on File Prior to Visit  Medication Dose Route Frequency Provider Last Rate Last Admin  . gadopentetate dimeglumine (MAGNEVIST) injection 12 mL  12 mL Intravenous Once PRN Garvin Fila, MD        Allergies:  No Known Allergies  Physical Exam Today's Vitals   01/18/20 0753  BP: (!) 155/90  Pulse: (!) 56  Weight: 129 lb (58.5 kg)  Height: 5' 5"  (1.651 m)   Body mass index is 21.47 kg/m.  General: well developed, well nourished pleasant Asian young lady, seated, in no evident distress Head: head normocephalic and atraumatic.   Neck: supple with no carotid or supraclavicular bruits Cardiovascular: regular rate and rhythm, no murmurs Musculoskeletal: no deformity.  Skin:  no rash/petichiae Vascular:  Normal pulses all extremities  Neurologic Exam Mental Status: Awake and fully alert. Fluent speech and language. Oriented to place and time. Recent and remote memory intact. Attention span, concentration and fund of knowledge appropriate. Mood and affect appropriate.  Cranial Nerves: Pupils equal, briskly reactive to light. Extraocular movements full without nystagmus. Visual fields full to confrontation. Hearing intact. Facial sensation intact. Face, tongue, palate moves normally and symmetrically.  Motor: Normal bulk and tone. Normal strength in all tested extremity muscles. Sensory.: intact to touch , pinprick , position and vibratory sensation.  Coordination: Rapid alternating movements normal in all extremities. Finger-to-nose and heel-to-shin performed accurately bilaterally. Gait and Station: Arises from chair without difficulty. Stance is normal. Gait demonstrates normal stride length and balance . Able to heel, toe and tandem walk without difficulty.  Reflexes: 1+ and symmetric. Toes downgoing.       ASSESSMENT/PLAN: 39 year  lady with long-standing history of migraine headaches with prior increase in  frequency, severity as well as intermittent left body paresthesias of unclear etiology. Headaches appear to have responded to Topamax but paresthesias continue randomly. Recent diagnosis of Sjogren`s syndrome.     Continue topamax 18m twice a  -refill  provided Continue relpax 36m as needed for emergent relief -refill provided Discussed importance of avoiding migraine triggers Advised to continue to stay active and eat healthy  Follow-up in 1 year with 15 min VV for medication refill or call earlier if needed  I spent 20 minutes of face-to-face and non-face-to-face time with patient.  This included previsit chart review, lab review, study review, order entry, electronic health record documentation, patient education regarding ongoing use of Topamax and Relpax for migraine management, avoidance of migraine triggers and answered all questions to patient satisfaction   JFrann Rider [facility] [address] [place] [address] Phone [phone] [phone]: This document was prepared with digital dictation and possible smart phrase technology. Any transcriptional errors that result from this process are unintentional.

## 2020-01-18 NOTE — Progress Notes (Signed)
I agree with the above plan 

## 2020-01-18 NOTE — Patient Instructions (Signed)
Your Plan:  Continue topamax 22m twice daily and relpax as needed for emergent relief  Refills provided    Follow up in 1 year or call earlier if needed      Thank you for [facility]. I hope we have been able to provide you high quality care today.  You may receive a patient satisfaction survey over the next few weeks. We would appreciate your feedback and comments so that we may continue to improve ourselves and the health of our patients.

## 2020-10-11 ENCOUNTER — Encounter: Payer: Self-pay | Admitting: Obstetrics

## 2020-10-11 ENCOUNTER — Ambulatory Visit (INDEPENDENT_AMBULATORY_CARE_PROVIDER_SITE_OTHER): Payer: 59 | Admitting: Obstetrics

## 2020-10-11 ENCOUNTER — Other Ambulatory Visit (HOSPITAL_COMMUNITY)
Admission: RE | Admit: 2020-10-11 | Discharge: 2020-10-11 | Disposition: A | Discharge status: Home / Self Care | Payer: 59 | Source: Ambulatory Visit | Attending: Obstetrics | Admitting: Obstetrics

## 2020-10-11 ENCOUNTER — Other Ambulatory Visit: Payer: Self-pay

## 2020-10-11 VITALS — BP 151/91 | HR 68 | Ht 65.0 in | Wt 132.0 lb

## 2020-10-11 DIAGNOSIS — Z30011 Encounter for initial prescription of contraceptive pills: Secondary | ICD-10-CM

## 2020-10-11 DIAGNOSIS — N898 Other specified noninflammatory disorders of vagina: Secondary | ICD-10-CM

## 2020-10-11 DIAGNOSIS — N943 Premenstrual tension syndrome: Secondary | ICD-10-CM

## 2020-10-11 DIAGNOSIS — N951 Menopausal and female climacteric states: Secondary | ICD-10-CM | POA: Diagnosis not present

## 2020-10-11 DIAGNOSIS — Z1239 Encounter for other screening for malignant neoplasm of breast: Secondary | ICD-10-CM

## 2020-10-11 DIAGNOSIS — Z01419 Encounter for gynecological examination (general) (routine) without abnormal findings: Secondary | ICD-10-CM

## 2020-10-11 DIAGNOSIS — Z3009 Encounter for other general counseling and advice on contraception: Secondary | ICD-10-CM

## 2020-10-11 MED ORDER — LO LOESTRIN FE 1 MG-10 MCG / 10 MCG PO TABS: 1.0000 | ORAL_TABLET | Freq: Every day | ORAL | 4 refills | Status: DC

## 2020-10-11 NOTE — Progress Notes (Signed)
Patient presents for AEX. Patient would like to discuss hormone therapy. No other concerns.  Last Pap 06/2015 normal Last MM: 2018 Normal

## 2020-10-11 NOTE — Progress Notes (Addendum)
Subjective:        Janet Gibson is a 45 y.o. female here for a routine exam.  Current complaints: Emotional lability and mood swings and irritability around. period time.  Just has no control emotionally, and finds it difficult to be around family, friends and Medical laboratory scientific officer.  Also c/o vaginal discharge.  Denies vaginal irritation or dysuria.  Personal health questionnaire:  Is patient Ashkenazi Jewish, have a family history of breast and/or ovarian cancer: no Is there a family history of uterine cancer diagnosed at age < 70, gastrointestinal cancer, urinary tract cancer, family member who is a Field seismologist syndrome-associated carrier: no Is the patient overweight and hypertensive, family history of diabetes, personal history of gestational diabetes, preeclampsia or PCOS: no Is patient over 10, have PCOS,  family history of premature CHD under age 78, diabetes, smoke, have hypertension or peripheral artery disease:  no At any time, has a partner hit, kicked or otherwise hurt or frightened you?: no Over the past 2 weeks, have you felt down, depressed or hopeless?: no Over the past 2 weeks, have you felt little interest or pleasure in doing things?:no   Gynecologic History Patient's last menstrual period was 09/27/2020 (approximate). Contraception:  sponge Last Pap: 09-27-2020. Results were: normal Last mammogram: 2018. Results were: normal  Obstetric History OB History  Gravida Para Term Preterm AB Living  0 0 0 0 0 0  SAB IAB Ectopic Multiple Live Births  0 0 0 0      Past Medical History:  Diagnosis Date   Abnormal Pap smear of cervix    Allergy    seasonal   Anxiety    Constipation    Depression    GERD (gastroesophageal reflux disease)    Headache    migraines   Ovarian cyst     Past Surgical History:  Procedure Laterality Date   COLPOSCOPY     LAPAROSCOPY N/A 05/12/2014   Procedure: LAPAROSCOPY DIAGNOSTIC WITH CHROMO IPERTUBATION WITH BILATERAL PERITUBAL CYST DRAINAGE;   Surgeon: [name], MD;  Location: [facility];  Service: Gynecology;  Laterality: N/A;   WISDOM TOOTH EXTRACTION       Current Outpatient Medications:    aspirin 81 MG chewable tablet, 1 tablet, Disp: , Rfl:    cetirizine (ZYRTEC) 10 MG tablet, Take 10 mg by mouth daily., Disp: , Rfl:    docusate sodium (COLACE) 100 MG capsule, Take 100 mg by mouth 2 (two) times daily., Disp: , Rfl:    DULoxetine (CYMBALTA) 20 MG capsule, Take 1 capsule by mouth daily., Disp: , Rfl:    eletriptan (RELPAX) 20 MG tablet, TAKE ONE TABLET BY MOUTH AS NEEDED FOR MIGRAINE OR HEADACHE. MAY REPEAT IN 2 HOURS IF HEADACHE PRESISTS OR RECURS, Disp: 10 tablet, Rfl: 3   ibuprofen (ADVIL,MOTRIN) 200 MG tablet, Take 200 mg by mouth as needed for moderate pain. Reported on 09/19/2015, Disp: , Rfl:    multivitamin-iron-minerals-folic acid (CENTRUM) chewable tablet, Chew 1 tablet by mouth daily. Reported on 07/14/2015, Disp: , Rfl:    pilocarpine (SALAGEN) 5 MG tablet, Take 5 mg by mouth 2 (two) times daily as needed., Disp: , Rfl:    pseudoephedrine (SUDAFED) 30 MG tablet, Take 30 mg by mouth every 4 (four) hours as needed for congestion., Disp: , Rfl:    topiramate (TOPAMAX) 25 MG tablet, Take 3 tablets (75 mg total) by mouth 2 (two) times daily., Disp: 540 tablet, Rfl: 3 No current facility-administered medications for this visit.  Facility-Administered Medications Ordered  in Other Visits:    gadopentetate dimeglumine (MAGNEVIST) injection 12 mL, 12 mL, Intravenous, Once PRN, Garvin Fila, MD No Known Allergies  Social History   Tobacco Use   Smoking status: Never   Smokeless tobacco: Never  Substance Use Topics   Alcohol use: Yes    Alcohol/week: 4.0 standard drinks    Types: 1 Glasses of wine, 3 Standard drinks or equivalent per week    Comment: occ.    Family History  Problem Relation Age of Onset   Heart disease Mother    Hypertension Mother    Stroke Mother    Heart disease Father    Hypertension Father     Stroke Father    Colon cancer Paternal Grandfather    Colitis Paternal Grandmother       Review of Systems  Constitutional: negative for fatigue and weight loss Respiratory: negative for cough and wheezing Cardiovascular: negative for chest pain, fatigue and palpitations Gastrointestinal: negative for abdominal pain and change in bowel habits Musculoskeletal:negative for myalgias Neurological: negative for gait problems and tremors Behavioral/Psych: negative for abusive relationship, depression Endocrine: negative for temperature intolerance    Genitourinary: positive for vaginal discharge.  negative for abnormal menstrual periods, genital lesions, hot flashes, sexual problems  Integument/breast: negative for breast lump, breast tenderness, nipple discharge and skin lesion(s)    Objective:       BP (!) 151/91   Pulse 68   Ht 5' 5"  (1.651 m)   Wt 132 lb (59.9 kg)   LMP 09/27/2020 (Approximate)   BMI 21.97 kg/m  General:   Alert and no distress  Skin:   no rash or abnormalities  Lungs:   clear to auscultation bilaterally  Heart:   regular rate and rhythm, S1, S2 normal, no murmur, click, rub or gallop  Breasts:   normal without suspicious masses, skin or nipple changes or axillary nodes  Abdomen:  normal findings: no organomegaly, soft, non-tender and no hernia  Pelvis:  External genitalia: normal general appearance Urinary system: urethral meatus normal and bladder without fullness, nontender Vaginal: normal without tenderness, induration or masses Cervix: normal appearance Adnexa: normal bimanual exam Uterus: anteverted and non-tender, normal size   Lab Review Urine pregnancy test Labs reviewed yes Radiologic studies reviewed yes  I have spent a total of 20 minutes of face-to-face time, excluding clinical staff time, reviewing notes and preparing to see patient, ordering tests and/or medications, and counseling the patient.   Assessment:    1. Encounter for  routine gynecological examination with Papanicolaou smear of cervix Rx: - Cytology - PAP( [place])  2. Perimenopause - experiencing some of the emotional changes - denies vasomotor changes, vaginal dryness or discomfort with intercourse  3. Vaginal discharge Rx: - Cervicovaginal ancillary only  4. PMS (premenstrual syndrome) - discussed diagnosis and management - educational material dispensed - taking an SSRI  5. Encounter for other general counseling or advice on contraception - low dose OCP recommended  6. Encounter for initial prescription of contraceptive pills - Lo Loestrin 28, 1 tab po daily ( samples dispensed )  7. Screening breast examination Rx: - MM Digital Screening; Future     Plan:    Education reviewed: calcium supplements, depression evaluation, low fat, low cholesterol diet, safe sex/STD prevention, self breast exams, skin cancer screening, and weight bearing exercise. Contraception: OCP (estrogen/progesterone). Mammogram ordered. Follow up in: 3 months.     Shelly Bombard, MD 10/11/2020 9:17 AM

## 2020-10-12 LAB — CERVICOVAGINAL ANCILLARY ONLY
Bacterial Vaginitis (gardnerella): NEGATIVE
Candida Glabrata: NEGATIVE
Candida Vaginitis: NEGATIVE
Chlamydia: NEGATIVE
Comment: NEGATIVE
Comment: NEGATIVE
Comment: NEGATIVE
Comment: NEGATIVE
Comment: NEGATIVE
Comment: NORMAL
Neisseria Gonorrhea: NEGATIVE
Trichomonas: NEGATIVE

## 2020-10-13 LAB — CYTOLOGY - PAP
Comment: NEGATIVE
Diagnosis: NEGATIVE
High risk HPV: NEGATIVE

## 2020-11-07 ENCOUNTER — Encounter: Payer: Self-pay | Admitting: Obstetrics

## 2021-01-19 ENCOUNTER — Other Ambulatory Visit: Payer: Self-pay | Admitting: Adult Health

## 2021-01-19 DIAGNOSIS — G43009 Migraine without aura, not intractable, without status migrainosus: Secondary | ICD-10-CM

## 2021-01-20 ENCOUNTER — Ambulatory Visit
Admission: RE | Admit: 2021-01-20 | Discharge: 2021-01-20 | Disposition: A | Discharge status: Home / Self Care | Payer: 59 | Source: Ambulatory Visit | Attending: Obstetrics | Admitting: Obstetrics

## 2021-01-20 ENCOUNTER — Other Ambulatory Visit: Payer: Self-pay

## 2021-01-20 DIAGNOSIS — Z1239 Encounter for other screening for malignant neoplasm of breast: Secondary | ICD-10-CM

## 2021-01-23 ENCOUNTER — Encounter: Payer: Self-pay | Admitting: Adult Health

## 2021-01-23 ENCOUNTER — Telehealth: Payer: 59 | Admitting: Adult Health

## 2021-01-23 DIAGNOSIS — G43009 Migraine without aura, not intractable, without status migrainosus: Secondary | ICD-10-CM | POA: Diagnosis not present

## 2021-01-23 MED ORDER — ELETRIPTAN HYDROBROMIDE 20 MG PO TABS: ORAL_TABLET | ORAL | 3 refills | Status: DC

## 2021-01-23 NOTE — Progress Notes (Signed)
[facility] [address] [place]. [place] [address] [phone]       OFFICE FOLLOW UP VISIT NOTE  [name] Date of Birth:  June 16, 1975 Medical Record Number:  [id number]   Referring MD:  Emeterio Reeve  Reason for Referral:  Headaches and numbness   Virtual Visit via Video Note  Virtual visit completed through [employer], a video enabled telemedicine application. Due to national recommendations of social distancing due to COVID-19, a virtual visit is felt to be most appropriate for this patient at this time. Reviewed limitations, risks, security and privacy concerns of performing a virtual visit and the availability of in person appointments. I also reviewed that there may be a patient responsible charge related to this service. The patient agreed to proceed.    Patient location: at work Provider location: in office, [facility] Persons participating in this virtual visit: patient and provider     HPI:   Today, 01/23/2021, Janet Gibson is being seen for yearly migraine follow-up via MyChart video visit. Overall doing well.  Does report slight increase of migraine headaches during allergy season with 2-3 migraine days per months but other months will experience any.  Continued use of topiramate 75 mg twice daily without side effects.  Continued use of Relpax for emergent relief with benefit.  Recently diagnosed with fibromyalgia and started on duloxetine -routinely followed by rheumatology.  No new concerns at this time.   History provided for reference purposes only Update 01/18/2020 JM: Janet Gibson is being seen for migraine follow-up with prior visit 10/2017.  Migraines have been stable since prior visit with ongoing use of Topamax 75 mg twice daily tolerating well without side effects.  Reports migraine occurrence once every 2 months.  She will use Relpax with migraine onset which will help decrease severity and usually prevent migraine  pain.  Continued intermittent paresthesias random but very seldom. She remains active and working without difficulty.  no concerns at this time.  10/16/17 UPDATE JM: Patient is being seen today for one-year follow-up for migraines.  She is currently taking Topamax 75 mg twice daily but has been controlling migraines well and she states she may have one every other month.  She states he is can start as being debilitating but will take Relpax and this does help decrease the amount of pain and in order to be functional.  She continues to work full-time and continues to do all previous activities.  Denies further concerns at this time.  03/20/2016 visit PS: She returns for follow-up after last visit 3 months ago. She stated the Relpax seems to work quite good for her. She responds complete resolution of migraines every time she is taken it. She continues to take Topamax 50 mg twice daily and is tolerating it well without any side effects but states that she has not noted any particular increase benefit from increasing the dose from 25 mg twice daily to 50 mg twice daily. She continued to have intermittent paresthesias which are random. They seem to be more in the left leg and left arm or face but can occur on the right side as well. These usually accompany her headaches but can occur without headaches as well. She has been diagnosed with Sjogren syndrome by rheumatologist Dr. Sabino Niemann who started on chloroquine. She did have MRI scan of the brain done on 01/13/16 which I have personally reviewed shows a few nonspecific white matter hyperintensities but no other major abnormalities. I discussed my findings  with her. Patient has no other new complaints today.  Initial Consult 12/27/2015 Dr. Leonie Man: 66 year lady whose had history of migraine headaches starting since her 17s. She has noticed increase in frequency and severity of her headaches in the last few years as well as more recently tingling sensations on the left  side. She states she woke up on August 18 with a terrible migraine as well as noticed tingling initially in the left cheek. Later on in the morning the tingling involving left shoulder as well as spread to the left leg. She takes ibuprofen 800 mg and usually it helps but on this occasion it did not help. She went to urgent care. She is started on, pending 103 times daily which she has been taking. However she continued to have tingling and numbness off and on lasting about 10 minutes or so. She also developed a few days later pain in the left shoulder as well as spasms and on her back on the upper chest. This has been happening intermittently. Interestingly she states when she gets back spasms her arm tingling disappears. The gabapentin 100 mg 3 times daily has not been helping her. The cramps seem to rotate locations but mostly in the upper back and left shoulder. She does complain of tightness in the neck muscles. Most of her migraine headaches interestingly are located in the back of the head. She denies any loss of vision, visual scotoma speech problems and dizziness vertigo gait or balance problems. [place] moved [place] ago from [place] this area. She was seen the neurologist at [place]. She remembers having had a brain scan done in 2009 which was normal. Recently patient was given a prednisone taper pack by her primary physician but that has not helped either the paresthesia of her headaches. She has tried Imitrex years ago but it did not help her migraine. She currently takes it on Namenda milligrams of ibuprofen. She has also been tried in the past on atenolol as well as amitriptyline for migraine prophylaxis but states it did not help. She has not been on Topamax. She has not tried any of the new triptan medications. She denies any family history of migraines. Denies any significant head injury with loss of consciousness. She does admit to getting fatigued easily but denies any  vertigo, diplopia, gait imbalance, bladder urgency or incontinence.     ROS:   14 system review of systems is positive for headache and all other systems negative  PMH:  Past Medical History:  Diagnosis Date   Abnormal Pap smear of cervix    Allergy    seasonal   Anxiety    Constipation    Depression    GERD (gastroesophageal reflux disease)    Headache    migraines   Ovarian cyst     Social History:  Social History   Socioeconomic History   Marital status: Married    Spouse name: Not on file   Number of children: Not on file   Years of education: Not on file   Highest education level: Not on file  Occupational History   Not on file  Tobacco Use   Smoking status: Never   Smokeless tobacco: Never  Vaping Use   Vaping Use: Never used  Substance and Sexual Activity   Alcohol use: Yes    Alcohol/week: 4.0 standard drinks    Types: 1 Glasses of wine, 3 Standard drinks or equivalent per week    Comment:  occ.   Drug use: No   Sexual activity: Yes    Partners: Male    Birth control/protection: Pill  Other Topics Concern   Not on file  Social History Narrative   Not on file   Social Determinants of Health   Financial Resource Strain: Not on file  Food Insecurity: Not on file  Transportation Needs: Not on file  Physical Activity: Not on file  Stress: Not on file  Social Connections: Not on file  Intimate Partner Violence: Not on file    Medications:   Current Outpatient Medications on File Prior to Visit  Medication Sig Dispense Refill   aspirin 81 MG chewable tablet 1 tablet     cetirizine (ZYRTEC) 10 MG tablet Take 10 mg by mouth daily.     docusate sodium (COLACE) 100 MG capsule Take 100 mg by mouth 2 (two) times daily.     DULoxetine (CYMBALTA) 20 MG capsule Take 1 capsule by mouth daily.     eletriptan (RELPAX) 20 MG tablet TAKE ONE TABLET BY MOUTH AS NEEDED FOR MIGRAINE OR HEADACHE. MAY REPEAT IN 2 HOURS IF HEADACHE PRESISTS OR RECURS 10 tablet 3    ibuprofen (ADVIL,MOTRIN) 200 MG tablet Take 200 mg by mouth as needed for moderate pain. Reported on 09/19/2015     LO LOESTRIN FE 1 MG-10 MCG / 10 MCG tablet Take 1 tablet by mouth daily. 84 tablet 4   multivitamin-iron-minerals-folic acid (CENTRUM) chewable tablet Chew 1 tablet by mouth daily. Reported on 07/14/2015     pilocarpine (SALAGEN) 5 MG tablet Take 5 mg by mouth 2 (two) times daily as needed.     pseudoephedrine (SUDAFED) 30 MG tablet Take 30 mg by mouth every 4 (four) hours as needed for congestion.     topiramate (TOPAMAX) 25 MG tablet TAKE THREE TABLETS BY MOUTH TWICE DAILY 540 tablet 3   Current Facility-Administered Medications on File Prior to Visit  Medication Dose Route Frequency Provider Last Rate Last Admin   gadopentetate dimeglumine (MAGNEVIST) injection 12 mL  12 mL Intravenous Once PRN Garvin Fila, MD        Allergies:  No Known Allergies  Physical Exam General: well developed, well nourished pleasant Asian young lady, seated, in no evident distress  Neurologic Exam Mental Status: Awake and fully alert. Fluent speech and language. Oriented to place and time. Recent and remote memory intact. Attention span, concentration and fund of knowledge appropriate. Mood and affect appropriate.  Motor: No evidence of pronator drift Coordination: Rapid alternating movements normal in all extremities. Finger-to-nose and heel-to-shin performed accurately bilaterally.       ASSESSMENT/PLAN: 56 year lady with long-standing history of migraine headaches with prior increase in  frequency, severity as well as intermittent left body paresthesias of unclear etiology.  Migraine headaches greatly improved with use of topiramate   Continue topamax 80m twice a  -refill provided Continue relpax 225mas needed for emergent relief -refill provided Discussed importance of avoiding migraine triggers Advised to continue to stay active and eat healthy    Follow-up in 1 year with 15  min VV for medication refill or call earlier if needed   I spent 13 minutes of face-to-face and non-face-to-face time with patient via [facility] visit.  This included previsit chart review, lab review, order entry, electronic health record documentation, patient education regarding ongoing use of Topamax and Relpax for migraine management, avoidance of migraine triggers and answered all other questions to patient satisfaction   Janet Gibson  AGNP-BC  [facility] [address] [place], [place] [address]  Phone [phone] Fax [phone] Note: This document was prepared with digital dictation and possible smart phrase technology. Any transcriptional errors that result from this process are unintentional.

## 2021-05-01 ENCOUNTER — Encounter: Payer: Self-pay | Admitting: Adult Health

## 2021-05-01 ENCOUNTER — Encounter: Payer: Self-pay | Admitting: Neurology

## 2021-05-02 ENCOUNTER — Telehealth: Payer: Self-pay | Admitting: *Deleted

## 2021-05-02 ENCOUNTER — Encounter: Payer: Self-pay | Admitting: Adult Health

## 2021-05-02 ENCOUNTER — Ambulatory Visit: Payer: 59 | Admitting: Adult Health

## 2021-05-02 VITALS — BP 140/94 | HR 76 | Ht 65.0 in | Wt 145.0 lb

## 2021-05-02 DIAGNOSIS — G43009 Migraine without aura, not intractable, without status migrainosus: Secondary | ICD-10-CM

## 2021-05-02 DIAGNOSIS — B0230 Zoster ocular disease, unspecified: Secondary | ICD-10-CM | POA: Diagnosis not present

## 2021-05-02 DIAGNOSIS — G245 Blepharospasm: Secondary | ICD-10-CM | POA: Diagnosis not present

## 2021-05-02 MED ORDER — PREDNISONE 20 MG PO TABS: 60.0000 mg | ORAL_TABLET | Freq: Every day | ORAL | 0 refills | Status: AC

## 2021-05-02 MED ORDER — VALACYCLOVIR HCL 1 G PO TABS: 1000.0000 mg | ORAL_TABLET | Freq: Three times a day (TID) | ORAL | 0 refills | Status: AC

## 2021-05-02 NOTE — Progress Notes (Signed)
[facility] [address] [place] [address] [phone]       OFFICE FOLLOW UP VISIT NOTE  [name] Date of Birth:  December 28, 1975 Medical Record Number:  [id number]   Referring MD:  Emeterio Reeve  Reason for visit: new onset left facial pain, right eye twitching    Chief Complaint  Patient presents with   Follow-up    Rm 3 alone Pt is well, here FU on mychart messages from yesterday regarding R eye twitching and sharp pain on L side of head     HPI:   Update 05/02/2021 JM: patient being seen for acute visit for c/o right eye twitching for past 2 weeks and on 1/14 woke up with a sharp shooting pain around left orbital and temple area. Still feels sensitivity in the area such as when she is when she is brushing her hair, left eye pressure sensation and occasional shooting pains in temporal area and ear. Upon seeing her today, evidence of rash in the V1 distribution - she reports this started to appear late last night and worse upon awakening this morning. Denies itching but more painful, burning sensation. Denies any visual loss or changes, vertigo or hearing loss.   Reports lower eye twitching for past 2 weeks - had issue prior but would not last long. Does endorse current flare up of Sjogren's syndrome with increased dry eye. Denies any increased stressors.      History provided for reference purposes only Update 01/23/2021 JM: Janet Gibson is being seen for yearly migraine follow-up via MyChart video visit. Overall doing well.  Does report slight increase of migraine headaches during allergy season with 2-3 migraine days per months but other months will experience any.  Continued use of topiramate 75 mg twice daily without side effects.  Continued use of Relpax for emergent relief with benefit.  Recently diagnosed with fibromyalgia and started on duloxetine -routinely followed by rheumatology.  No new concerns at this time.  Update 01/18/2020 JM:  Janet Gibson is being seen for migraine follow-up with prior visit 10/2017.  Migraines have been stable since prior visit with ongoing use of Topamax 75 mg twice daily tolerating well without side effects.  Reports migraine occurrence once every 2 months.  She will use Relpax with migraine onset which will help decrease severity and usually prevent migraine pain.  Continued intermittent paresthesias random but very seldom. She remains active and working without difficulty.  no concerns at this time.  10/16/17 UPDATE JM: Patient is being seen today for one-year follow-up for migraines.  She is currently taking Topamax 75 mg twice daily but has been controlling migraines well and she states she may have one every other month.  She states he is can start as being debilitating but will take Relpax and this does help decrease the amount of pain and in order to be functional.  She continues to work full-time and continues to do all previous activities.  Denies further concerns at this time.  03/20/2016 visit PS: She returns for follow-up after last visit 3 months ago. She stated the Relpax seems to work quite good for her. She responds complete resolution of migraines every time she is taken it. She continues to take Topamax 50 mg twice daily and is tolerating it well without any side effects but states that she has not noted any particular increase benefit from increasing the dose from 25 mg twice daily to 50 mg twice daily. She continued to have intermittent  paresthesias which are random. They seem to be more in the left leg and left arm or face but can occur on the right side as well. These usually accompany her headaches but can occur without headaches as well. She has been diagnosed with Sjogren syndrome by rheumatologist Dr. Sabino Niemann who started on chloroquine. She did have MRI scan of the brain done on 01/13/16 which I have personally reviewed shows a few nonspecific white matter hyperintensities but no other  major abnormalities. I discussed my findings with her. Patient has no other new complaints today.  Initial Consult 12/27/2015 Dr. Leonie Man: 72 year lady whose had history of migraine headaches starting since her 47s. She has noticed increase in frequency and severity of her headaches in the last few years as well as more recently tingling sensations on the left side. She states she woke up on August 18 with a terrible migraine as well as noticed tingling initially in the left cheek. Later on in the morning the tingling involving left shoulder as well as spread to the left leg. She takes ibuprofen 800 mg and usually it helps but on this occasion it did not help. She went to urgent care. She is started on, pending 103 times daily which she has been taking. However she continued to have tingling and numbness off and on lasting about 10 minutes or so. She also developed a few days later pain in the left shoulder as well as spasms and on her back on the upper chest. This has been happening intermittently. Interestingly she states when she gets back spasms her arm tingling disappears. The gabapentin 100 mg 3 times daily has not been helping her. The cramps seem to rotate locations but mostly in the upper back and left shoulder. She does complain of tightness in the neck muscles. Most of her migraine headaches interestingly are located in the back of the head. She denies any loss of vision, visual scotoma speech problems and dizziness vertigo gait or balance problems. [place] moved [place] ago from [place] this area. She was seen the neurologist at [place]. She remembers having had a brain scan done in 2009 which was normal. Recently patient was given a prednisone taper pack by her primary physician but that has not helped either the paresthesia of her headaches. She has tried Imitrex years ago but it did not help her migraine. She currently takes it on Namenda milligrams of ibuprofen. She has  also been tried in the past on atenolol as well as amitriptyline for migraine prophylaxis but states it did not help. She has not been on Topamax. She has not tried any of the new triptan medications. She denies any family history of migraines. Denies any significant head injury with loss of consciousness. She does admit to getting fatigued easily but denies any vertigo, diplopia, gait imbalance, bladder urgency or incontinence.     ROS:   14 system review of systems is positive for those listed in HPI and all other systems negative  PMH:  Past Medical History:  Diagnosis Date   Abnormal Pap smear of cervix    Allergy    seasonal   Anxiety    Constipation    Depression    GERD (gastroesophageal reflux disease)    Headache    migraines   Ovarian cyst     Social History:  Social History   Socioeconomic History   Marital status: Married    Spouse name: Not on file  Number of children: Not on file   Years of education: Not on file   Highest education level: Not on file  Occupational History   Not on file  Tobacco Use   Smoking status: Never   Smokeless tobacco: Never  Vaping Use   Vaping Use: Never used  Substance and Sexual Activity   Alcohol use: Yes    Alcohol/week: 4.0 standard drinks    Types: 1 Glasses of wine, 3 Standard drinks or equivalent per week    Comment: occ.   Drug use: No   Sexual activity: Yes    Partners: Male    Birth control/protection: Pill  Other Topics Concern   Not on file  Social History Narrative   Not on file   Social Determinants of Health   Financial Resource Strain: Not on file  Food Insecurity: Not on file  Transportation Needs: Not on file  Physical Activity: Not on file  Stress: Not on file  Social Connections: Not on file  Intimate Partner Violence: Not on file    Medications:   Current Outpatient Medications on File Prior to Visit  Medication Sig Dispense Refill   aspirin 81 MG chewable tablet 1 tablet     cetirizine  (ZYRTEC) 10 MG tablet Take 10 mg by mouth daily.     docusate sodium (COLACE) 100 MG capsule Take 100 mg by mouth 2 (two) times daily.     DULoxetine (CYMBALTA) 30 MG capsule Take 30 mg by mouth daily.     eletriptan (RELPAX) 20 MG tablet TAKE ONE TABLET BY MOUTH AS NEEDED FOR MIGRAINE OR HEADACHE. MAY REPEAT IN 2 HOURS IF HEADACHE PRESISTS OR RECURS 10 tablet 3   ibuprofen (ADVIL,MOTRIN) 200 MG tablet Take 200 mg by mouth as needed for moderate pain. Reported on 09/19/2015     LO LOESTRIN FE 1 MG-10 MCG / 10 MCG tablet Take 1 tablet by mouth daily. 84 tablet 4   multivitamin-iron-minerals-folic acid (CENTRUM) chewable tablet Chew 1 tablet by mouth daily. Reported on 07/14/2015     pilocarpine (SALAGEN) 5 MG tablet Take 5 mg by mouth 2 (two) times daily as needed.     pseudoephedrine (SUDAFED) 30 MG tablet Take 30 mg by mouth every 4 (four) hours as needed for congestion.     topiramate (TOPAMAX) 25 MG tablet TAKE THREE TABLETS BY MOUTH TWICE DAILY 540 tablet 3   Current Facility-Administered Medications on File Prior to Visit  Medication Dose Route Frequency Provider Last Rate Last Admin   gadopentetate dimeglumine (MAGNEVIST) injection 12 mL  12 mL Intravenous Once PRN Garvin Fila, MD        Allergies:  No Known Allergies  Physical Exam Today's Vitals   05/02/21 1501  BP: (!) 140/94  Pulse: 76  Weight: 145 lb (65.8 kg)  Height: 5' 5"  (1.651 m)   Body mass index is 24.13 kg/m.  General: well developed, well nourished, very pleasant middle-age Caucasian female, seated, in no evident distress Head: head normocephalic and atraumatic. Slight twitching under right eye. Normal appearance of left and right eyes   Neck: supple with no carotid or supraclavicular bruits Cardiovascular: regular rate and rhythm, no murmurs Musculoskeletal: no deformity Skin:  erythematous papules along V1 distribution, no evidence of vesicles in left ear Vascular:  Normal pulses all extremities    Neurologic Exam Mental Status: Awake and fully alert. Oriented to place and time. Recent and remote memory intact. Attention span, concentration and fund of knowledge appropriate. Mood and affect  appropriate.  Cranial Nerves: Pupils equal, briskly reactive to light. Extraocular movements full without nystagmus. Visual fields full to confrontation. Hearing intact. Facial sensation intact. tongue, palate moves normally and symmetrically. left facial left nasolabial fold flattening Motor: Normal bulk and tone. Normal strength in all tested extremity muscles Sensory.: intact to touch , pinprick , position and vibratory sensation.  Coordination: Rapid alternating movements normal in all extremities. Finger-to-nose and heel-to-shin performed accurately bilaterally. Gait and Station: Arises from chair without difficulty. Stance is normal. Gait demonstrates normal stride length and balance.  Reflexes: 1+ and symmetric. Toes downgoing.       ASSESSMENT/PLAN: 29 year lady with long-standing history of migraine headaches with prior increase in  frequency, severity as well as intermittent left body paresthesias of unclear etiology.  Migraine headaches greatly improved with use of topiramate. Returns today acutely for new onset left-sided headache and right eye twitching   1.  Herpes zoster -possibly herpes zoster ophthalmologist vs oticus as mixed features present -start valacyclovir 1000 mg 3 times daily x7 days and prednisone 60 mg daily x5 days -scheduled appt with ophthalmologist tomorrow morning at 8:30am -use of ibuprofen or acetaminophen for acute pain -discussed warning signs and to call 911 immediately if any should occur for more emergent treatment -advised to f/u with PCP after completion of antiviral  2. Right eye twitching -likely in setting of stressors vs dry eye with recent Sjogren's flare -continue to monitor  3.  Chronic migraines -Continue topiramate 75 mg twice  daily -Continue Relpax 20 mg as needed for emergent relief      CC:  [facility] provider: Dr. Sharmon Revere, Alvina Filbert, MD     I spent 32 minutes of face-to-face and non-face-to-face time with patient.  This included previsit chart review, lab review, order entry, electronic health record documentation, patient education regarding new onset facial pain and likely etiology as above, plan as noted above, right eye twitching concerns, ongoing use of Topamax and Relpax for migraine management and answered all other questions to patient satisfaction   Frann Rider, AGNP-BC  [facility] [address] [place], [place] [address]  Phone [phone] Fax [phone] Note: This document was prepared with digital dictation and possible smart phrase technology. Any transcriptional errors that result from this process are unintentional.

## 2021-05-02 NOTE — Patient Instructions (Addendum)
Start valacyclovir 1030m three times daily for 7 days and prednisone 672mper day for the next 5 days  Okay to use tylenol or ibuprofen as needed for pain relief   Please ensure you follow up with your eye doctor ASAP and your PCP once your complete your antiviral course     Shingles Shingles is an infection. It gives you a painful skin rash and blisters that have fluid in them. Shingles is caused by the same germ (virus) that causes chickenpox. Shingles only happens in people who: Have had chickenpox. Have been given a shot (vaccine) to protect against chickenpox. Shingles is rare in this group. What are the causes? This condition is caused by varicella-zoster virus. This is the same germ that causes chickenpox. After a person is exposed to the germ, the germ stays in the body but is not active (dormant). Shingles develops if the germ becomes active again (is reactivated). This can happen many years after the first exposure to the germ. It is not known what causes this germ to become active again. What increases the risk? People who have had chickenpox or received the chickenpox shot are at risk for shingles. This infection is more common in people who: Are older than 6020ears of age. Have a weakened disease-fighting system (immune system), such as people with: HIV (human immunodeficiency virus). AIDS (acquired immunodeficiency syndrome). Cancer. Are taking medicines that weaken the immune system, such as organ transplant medicines. Have a lot of stress. What are the signs or symptoms? The first symptoms of shingles may be itching, tingling, or pain in an area on your skin. A rash will show on your skin a few days or weeks later. This is what usually happens: The rash is likely to be on one side of your body. The rash usually has a shape like a belt or a band. Over time, the rash turns into fluid-filled blisters. The blisters will break open and change into scabs. The scabs usually  dry up in about 2-3 weeks. You may also have: A fever. Chills. A headache. A feeling like you may vomit (nausea). How is this treated? The rash may last for several weeks. There is not a specific cure for this condition. Your doctor may prescribe medicines. Medicines may: Help with pain. Help you get better sooner. Help to prevent long-term problems. Help with itching (antihistamines). If the area involved is on your face, you may need to see a specialist. This may be an eye doctor or an ear, nose, and throat (ENT) doctor. Follow these instructions at home: Medicines Take over-the-counter and prescription medicines only as told by your doctor. Put on an anti-itch cream or numbing cream where you have a rash, blisters, or scabs. Do this as told by your doctor. Helping with itching and discomfort  Put cold, wet cloths (cold compresses) on the area of the rash or blisters as told by your doctor. Cool baths can help you feel better. Try adding baking soda or dry oatmeal to the water to lessen itching. Do not bathe in hot water. Use calamine lotion as told by your doctor. Blister and rash care Keep your rash covered with a loose bandage (dressing). Wear loose clothing that does not rub on your rash. Wash your hands with soap and water for at least 20 seconds before and after you change your bandage. If you cannot use soap and water, use hand sanitizer. Change your bandage as told by your doctor. Keep your rash and blisters  clean. To do this, wash the area with mild soap and cool water as told by your doctor. Check your rash every day for signs of infection. Check for: More redness, swelling, or pain. Fluid or blood. Warmth. Pus or a bad smell. Do not scratch your rash. Do not pick at your blisters. To help you to not scratch: Keep your fingernails clean and cut short. Wear gloves or mittens when you sleep, if scratching is a problem. General instructions Rest as told by your  doctor. Wash your hands often with soap and water for at least 20 seconds. If you cannot use soap and water, use hand sanitizer. Doing this lowers your chance of getting a skin infection. Your infection can cause chickenpox in people who have never had chickenpox or never got a chickenpox vaccine shot. If you have blisters that did not change into scabs yet, try not to touch other people or be around other people, especially: Babies. Pregnant women. Children who have areas of red, itchy, or rough skin (eczema). Older people who have organ transplants. People who have a long-term (chronic) illness, like cancer or AIDS. Keep all follow-up visits. How is this prevented? A vaccine shot is the best way to prevent shingles and protect against shingles problems. If you have not had a vaccine shot, talk with your doctor about getting it. Where to find more information Centers for Disease Control and Prevention: http://www.wolf.info/ Contact a doctor if: Your pain does not get better with medicine. Your pain does not get better after the rash heals. You have any of these signs of infection around the rash: More redness, swelling, or pain. Fluid or blood. Warmth. Pus or a bad smell. You have a fever. Get help right away if: The rash is on your face or nose. You have pain in your face or pain by your eye. You lose feeling on one side of your face. You have trouble seeing. You have ear pain, or you have ringing in your ear. You have a loss of taste. Your condition gets worse. Summary Shingles gives you a painful skin rash and blisters that have fluid in them. Shingles is caused by the same germ (virus) that causes chickenpox. Keep your rash covered with a loose bandage. Wear loose clothing that does not rub on your rash. If you have blisters that did not change into scabs yet, try not to touch other people or be around people. This information is not intended to replace advice given to you by your  health care provider. Make sure you discuss any questions you have with your health care provider. Document Revised: 03/28/2020 Document Reviewed: 03/28/2020 Elsevier Patient Education  2022 [employer].

## 2021-05-02 NOTE — Telephone Encounter (Signed)
Yes Dr. Jaynee Eagles I am not sure how she even was able to send you a message? Anyways, can we get this patient scheduled to be seen? I do have openings this afternoon if she is able to make it. Thank you!

## 2021-05-02 NOTE — Telephone Encounter (Signed)
Per Janett Billow NP, called patient and scheduled her to see NP this afternoon. Canceled appointment with Dr Jaynee Eagles, no longer needed. Asked patient to arrive early and bring new insurance cards. Patient verbalized understanding, appreciation.

## 2021-05-03 ENCOUNTER — Encounter: Payer: Self-pay | Admitting: Adult Health

## 2021-05-04 ENCOUNTER — Telehealth: Payer: Self-pay | Admitting: *Deleted

## 2021-05-04 NOTE — Telephone Encounter (Signed)
Yes prednisone daily - no taper needed. Can we get ophthalmology report? Advised patient to request they send this after visit but not sure if we have received anything. Thank you

## 2021-05-04 NOTE — Telephone Encounter (Signed)
[facility], Dr Oneta Rack office and requested office notes. Medical records stated they would fax over now.  Received notes, placed on NP's desk for review.

## 2021-05-08 NOTE — Telephone Encounter (Signed)
Called patient, LVM advising her of NP's message. Requested she call back or send my chart letting  us know how she is doing.

## 2021-05-08 NOTE — Telephone Encounter (Signed)
Reviewed exam note from [facility]. Evaluated on 1/18. Per note review, no evidence of eye involvement and f/u in 2 weeks time. Continue current treatment plan.   Can we see how patient is doing? Should be completing valacyclovir course today. Thank you

## 2021-05-11 ENCOUNTER — Encounter: Payer: Self-pay | Admitting: Adult Health

## 2021-05-16 NOTE — Telephone Encounter (Signed)
Pt sent MyChart message, 05/11/21

## 2021-05-25 ENCOUNTER — Ambulatory Visit: Payer: 59 | Admitting: Neurology

## 2021-06-15 ENCOUNTER — Ambulatory Visit: Payer: 59 | Admitting: Neurology

## 2021-07-19 ENCOUNTER — Encounter: Payer: Self-pay | Admitting: Internal Medicine

## 2021-12-12 ENCOUNTER — Ambulatory Visit: Payer: 59 | Admitting: Obstetrics and Gynecology

## 2021-12-12 VITALS — BP 127/82 | HR 52 | Ht 65.0 in | Wt 140.0 lb

## 2021-12-12 DIAGNOSIS — Z3041 Encounter for surveillance of contraceptive pills: Secondary | ICD-10-CM | POA: Diagnosis not present

## 2021-12-12 MED ORDER — NORETHIN ACE-ETH ESTRAD-FE 1-20 MG-MCG(24) PO TABS: 1.0000 | ORAL_TABLET | Freq: Every day | ORAL | 11 refills | Status: AC

## 2021-12-12 NOTE — Progress Notes (Signed)
  CC: birth control modification Subjective:    Patient ID: Janet Gibson, female    DOB: Sep 12, 1975, 46 y.o.   MRN: [id number]  HPI 46 yo G0 seen for discussion of birth control.  She likes her current birth control, but finds it too expensive.  She also notes some spotting if she is late taking her pill by 1-2 hours.   Review of Systems     Objective:   Physical Exam Vitals:   12/12/21 0849  BP: 127/82  Pulse: (!) 52         Assessment & Plan:   1. Encounter for birth control pills maintenance Will have trial of loestrin 24 (generic) with increased estrogen to 20 mg - Norethindrone Acetate-Ethinyl Estrad-FE (LOESTRIN 24 FE) 1-20 MG-MCG(24) tablet; Take 1 tablet by mouth daily.  Dispense: 28 tablet; Refill: 11  F/u in 3 months with virtual visit  I spent 20 minutes dedicated to the care of this patient including previsit review of records, face to face time with the patient discussing treatment options and post visit testing.   Griffin Basil, MD Faculty Attending, Center for [facility]

## 2021-12-12 NOTE — Progress Notes (Signed)
Pt would like to discuss BC and spotting.  Pt states she will have prolong spotting if missing dose by a few hours.

## 2022-02-12 ENCOUNTER — Other Ambulatory Visit: Payer: Self-pay | Admitting: Adult Health

## 2022-02-12 DIAGNOSIS — G43009 Migraine without aura, not intractable, without status migrainosus: Secondary | ICD-10-CM

## 2022-02-26 NOTE — Progress Notes (Unsigned)
[facility] [address] [place]. [place]  [address] [phone]       OFFICE FOLLOW UP VISIT NOTE  Ms. Aubrea Meixner Date of Birth:  March 01, 1976 Medical Record Number:  [id number]   Referring MD:  Emeterio Reeve  Reason for visit: new onset left facial pain, right eye twitching    No chief complaint on file.    HPI:   Update 02/27/2022 JM: Patient returns for migraine follow-up.  She was previously seen 10 months ago for likely herpes zoster infection ***.   Migraines overall stable, reports *** migraine days per month.  Remains on topiramate 75 mg twice daily and Relpax as needed for acute migraines with benefit.        History provided for reference purposes only Update 05/02/2021 JM: patient being seen for acute visit for c/o right eye twitching for past 2 weeks and on 1/14 woke up with a sharp shooting pain around left orbital and temple area. Still feels sensitivity in the area such as when she is when she is brushing her hair, left eye pressure sensation and occasional shooting pains in temporal area and ear. Upon seeing her today, evidence of rash in the V1 distribution - she reports this started to appear late last night and worse upon awakening this morning. Denies itching but more painful, burning sensation. Denies any visual loss or changes, vertigo or hearing loss.   Reports lower eye twitching for past 2 weeks - had issue prior but would not last long. Does endorse current flare up of Sjogren's syndrome with increased dry eye. Denies any increased stressors.   Update 01/23/2021 JM: Ms. Zaugg is being seen for yearly migraine follow-up via MyChart video visit. Overall doing well.  Does report slight increase of migraine headaches during allergy season with 2-3 migraine days per months but other months will experience any.  Continued use of topiramate 75 mg twice daily without side effects.  Continued use of Relpax for emergent relief with benefit.   Recently diagnosed with fibromyalgia and started on duloxetine -routinely followed by rheumatology.  No new concerns at this time.  Update 01/18/2020 JM: Ms. Liwanag is being seen for migraine follow-up with prior visit 10/2017.  Migraines have been stable since prior visit with ongoing use of Topamax 75 mg twice daily tolerating well without side effects.  Reports migraine occurrence once every 2 months.  She will use Relpax with migraine onset which will help decrease severity and usually prevent migraine pain.  Continued intermittent paresthesias random but very seldom. She remains active and working without difficulty.  no concerns at this time.  10/16/17 UPDATE JM: Patient is being seen today for one-year follow-up for migraines.  She is currently taking Topamax 75 mg twice daily but has been controlling migraines well and she states she may have one every other month.  She states he is can start as being debilitating but will take Relpax and this does help decrease the amount of pain and in order to be functional.  She continues to work full-time and continues to do all previous activities.  Denies further concerns at this time.  03/20/2016 visit PS: She returns for follow-up after last visit 3 months ago. She stated the Relpax seems to work quite good for her. She responds complete resolution of migraines every time she is taken it. She continues to take Topamax 50 mg twice daily and is tolerating it well without any side effects but states that she has not noted any particular  increase benefit from increasing the dose from 25 mg twice daily to 50 mg twice daily. She continued to have intermittent paresthesias which are random. They seem to be more in the left leg and left arm or face but can occur on the right side as well. These usually accompany her headaches but can occur without headaches as well. She has been diagnosed with Sjogren syndrome by rheumatologist Dr. Sabino Niemann who started on  chloroquine. She did have MRI scan of the brain done on 01/13/16 which I have personally reviewed shows a few nonspecific white matter hyperintensities but no other major abnormalities. I discussed my findings with her. Patient has no other new complaints today.  Initial Consult 12/27/2015 Dr. Leonie Man: 89 year lady whose had history of migraine headaches starting since her 20s. She has noticed increase in frequency and severity of her headaches in the last few years as well as more recently tingling sensations on the left side. She states she woke up on August 18 with a terrible migraine as well as noticed tingling initially in the left cheek. Later on in the morning the tingling involving left shoulder as well as spread to the left leg. She takes ibuprofen 800 mg and usually it helps but on this occasion it did not help. She went to urgent care. She is started on, pending 103 times daily which she has been taking. However she continued to have tingling and numbness off and on lasting about 10 minutes or so. She also developed a few days later pain in the left shoulder as well as spasms and on her back on the upper chest. This has been happening intermittently. Interestingly she states when she gets back spasms her arm tingling disappears. The gabapentin 100 mg 3 times daily has not been helping her. The cramps seem to rotate locations but mostly in the upper back and left shoulder. She does complain of tightness in the neck muscles. Most of her migraine headaches interestingly are located in the back of the head. She denies any loss of vision, visual scotoma speech problems and dizziness vertigo gait or balance problems. [place] moved a [place] ago from [place] this area. She was seen the neurologist at [place]. She remembers having had a brain scan done in 2009 which was normal. Recently patient was given a prednisone taper pack by her primary physician but that has not helped either the  paresthesia of her headaches. She has tried Imitrex years ago but it did not help her migraine. She currently takes it on Namenda milligrams of ibuprofen. She has also been tried in the past on atenolol as well as amitriptyline for migraine prophylaxis but states it did not help. She has not been on Topamax. She has not tried any of the new triptan medications. She denies any family history of migraines. Denies any significant head injury with loss of consciousness. She does admit to getting fatigued easily but denies any vertigo, diplopia, gait imbalance, bladder urgency or incontinence.     ROS:   14 system review of systems is positive for those listed in HPI and all other systems negative  PMH:  Past Medical History:  Diagnosis Date   Abnormal Pap smear of cervix    Allergy    seasonal   Anxiety    Constipation    Depression    GERD (gastroesophageal reflux disease)    Headache    migraines   Ovarian cyst     Social  History:  Social History   Socioeconomic History   Marital status: Married    Spouse name: Not on file   Number of children: Not on file   Years of education: Not on file   Highest education level: Not on file  Occupational History   Not on file  Tobacco Use   Smoking status: Never   Smokeless tobacco: Never  Vaping Use   Vaping Use: Never used  Substance and Sexual Activity   Alcohol use: Yes    Alcohol/week: 4.0 standard drinks of alcohol    Types: 1 Glasses of wine, 3 Standard drinks or equivalent per week    Comment: occ.   Drug use: No   Sexual activity: Yes    Partners: Male    Birth control/protection: Pill  Other Topics Concern   Not on file  Social History Narrative   Not on file   Social Determinants of Health   Financial Resource Strain: Not on file  Food Insecurity: Not on file  Transportation Needs: Not on file  Physical Activity: Not on file  Stress: Not on file  Social Connections: Not on file  Intimate Partner Violence: Not  on file    Medications:   Current Outpatient Medications on File Prior to Visit  Medication Sig Dispense Refill   aspirin 81 MG chewable tablet 1 tablet     cetirizine (ZYRTEC) 10 MG tablet Take 10 mg by mouth daily.     docusate sodium (COLACE) 100 MG capsule Take 100 mg by mouth 2 (two) times daily.     eletriptan (RELPAX) 20 MG tablet TAKE ONE TABLET BY MOUTH AS NEEDED FOR MIGRAINE OR HEADACHE. MAY REPEAT IN 2 HOURS IF HEADACHE PRESISTS OR RECURS 10 tablet 3   gabapentin (NEURONTIN) 100 MG capsule Take 200 mg by mouth 3 (three) times daily.     ibuprofen (ADVIL,MOTRIN) 200 MG tablet Take 200 mg by mouth as needed for moderate pain. Reported on 09/19/2015     multivitamin-iron-minerals-folic acid (CENTRUM) chewable tablet Chew 1 tablet by mouth daily. Reported on 07/14/2015     Norethindrone Acetate-Ethinyl Estrad-FE (LOESTRIN 24 FE) 1-20 MG-MCG(24) tablet Take 1 tablet by mouth daily. 28 tablet 11   pilocarpine (SALAGEN) 5 MG tablet Take 5 mg by mouth 2 (two) times daily as needed.     pseudoephedrine (SUDAFED) 30 MG tablet Take 30 mg by mouth every 4 (four) hours as needed for congestion.     topiramate (TOPAMAX) 25 MG tablet TAKE THREE TABLETS BY MOUTH TWICE DAILY 540 tablet 3   Current Facility-Administered Medications on File Prior to Visit  Medication Dose Route Frequency Provider Last Rate Last Admin   gadopentetate dimeglumine (MAGNEVIST) injection 12 mL  12 mL Intravenous Once PRN Garvin Fila, MD        Allergies:  No Known Allergies  Physical Exam There were no vitals filed for this visit.  There is no height or weight on file to calculate BMI.  General: well developed, well nourished, very pleasant middle-age Caucasian female, seated, in no evident distress Head: head normocephalic and atraumatic. Slight twitching under right eye. Normal appearance of left and right eyes   Neck: supple with no carotid or supraclavicular bruits Cardiovascular: regular rate and rhythm, no  murmurs Musculoskeletal: no deformity Skin:  erythematous papules along V1 distribution, no evidence of vesicles in left ear Vascular:  Normal pulses all extremities   Neurologic Exam Mental Status: Awake and fully alert. Oriented to place and time. Recent and  remote memory intact. Attention span, concentration and fund of knowledge appropriate. Mood and affect appropriate.  Cranial Nerves: Pupils equal, briskly reactive to light. Extraocular movements full without nystagmus. Visual fields full to confrontation. Hearing intact. Facial sensation intact. tongue, palate moves normally and symmetrically. left facial left nasolabial fold flattening Motor: Normal bulk and tone. Normal strength in all tested extremity muscles Sensory.: intact to touch , pinprick , position and vibratory sensation.  Coordination: Rapid alternating movements normal in all extremities. Finger-to-nose and heel-to-shin performed accurately bilaterally. Gait and Station: Arises from chair without difficulty. Stance is normal. Gait demonstrates normal stride length and balance.  Reflexes: 1+ and symmetric. Toes downgoing.       ASSESSMENT/PLAN: 70 year lady with long-standing history of migraine headaches with prior increase in  frequency, severity as well as intermittent left body paresthesias of unclear etiology.  Migraine headaches greatly improved with use of topiramate. Returns today acutely for new onset left-sided headache and right eye twitching   1.  Herpes zoster -possibly herpes zoster ophthalmologist vs oticus as mixed features present -start valacyclovir 1000 mg 3 times daily x7 days and prednisone 60 mg daily x5 days -scheduled appt with ophthalmologist tomorrow morning at 8:30am -use of ibuprofen or acetaminophen for acute pain -discussed warning signs and to call 911 immediately if any should occur for more emergent treatment -advised to f/u with PCP after completion of antiviral  2. Right eye  twitching -likely in setting of stressors vs dry eye with recent Sjogren's flare -continue to monitor  3.  Chronic migraines -Continue topiramate 75 mg twice daily -Continue Relpax 20 mg as needed for emergent relief      CC:  [name], MD     I spent 32 minutes of face-to-face and non-face-to-face time with patient.  This included previsit chart review, lab review, order entry, electronic health record documentation, patient education regarding new onset facial pain and likely etiology as above, plan as noted above, right eye twitching concerns, ongoing use of Topamax and Relpax for migraine management and answered all other questions to patient satisfaction   Frann Rider, AGNP-BC  [facility] [address] [place], [place] [address]  Phone [phone] Fax [phone] Note: This document was prepared with digital dictation and possible smart phrase technology. Any transcriptional errors that result from this process are unintentional.

## 2022-02-27 ENCOUNTER — Encounter: Payer: Self-pay | Admitting: Adult Health

## 2022-02-27 ENCOUNTER — Ambulatory Visit: Payer: 59 | Admitting: Adult Health

## 2022-02-27 DIAGNOSIS — G43009 Migraine without aura, not intractable, without status migrainosus: Secondary | ICD-10-CM | POA: Diagnosis not present

## 2022-02-27 MED ORDER — ELETRIPTAN HYDROBROMIDE 20 MG PO TABS: ORAL_TABLET | ORAL | 11 refills | Status: DC

## 2022-02-27 MED ORDER — TOPIRAMATE 25 MG PO TABS: 75.0000 mg | ORAL_TABLET | Freq: Two times a day (BID) | ORAL | 3 refills | Status: DC

## 2022-03-05 ENCOUNTER — Telehealth (INDEPENDENT_AMBULATORY_CARE_PROVIDER_SITE_OTHER): Payer: 59 | Admitting: Obstetrics and Gynecology

## 2022-03-05 DIAGNOSIS — Z3009 Encounter for other general counseling and advice on contraception: Secondary | ICD-10-CM | POA: Diagnosis not present

## 2022-03-05 NOTE — Progress Notes (Signed)
    GYNECOLOGY VIRTUAL VISIT ENCOUNTER NOTE  Provider location: [facility] [place] [facility]   Patient location: Home  I connected with Marian Sorrow on 03/05/22 at 11:15 AM EST by MyChart Video Encounter and verified that I am speaking with the correct person using two identifiers.   I discussed the limitations, risks, security and privacy concerns of performing an evaluation and management service virtually and the availability of in person appointments. I also discussed with the patient that there may be a patient responsible charge related to this service. The patient expressed understanding and agreed to proceed.   History:  Fatina Sprankle is a 46 y.o. G0P0000 female being evaluated today for follow up on OCP dosing/management. She denies any abnormal vaginal discharge, bleeding, pelvic pain or other concerns.  Today we discussed her usage of loestrin 24 as her previous OCP caused a lot of spotting.  The patient noted the new formulation has been very effective and she has little to no spotting.  She wishes to continue with this medication.     Past Medical History:  Diagnosis Date   Abnormal Pap smear of cervix    Allergy    seasonal   Anxiety    Constipation    Depression    GERD (gastroesophageal reflux disease)    Headache    migraines   Ovarian cyst    Past Surgical History:  Procedure Laterality Date   COLPOSCOPY     LAPAROSCOPY N/A 05/12/2014   Procedure: LAPAROSCOPY DIAGNOSTIC WITH CHROMO IPERTUBATION WITH BILATERAL PERITUBAL CYST DRAINAGE;  Surgeon: [name], MD;  Location: [facility];  Service: Gynecology;  Laterality: N/A;   WISDOM TOOTH EXTRACTION     The following portions of the patient's history were reviewed and updated as appropriate: allergies, current medications, past family history, past medical history, past social history, past surgical history and problem list.   Health Maintenance:  Normal pap and negative HRHPV on 09/21/20.  Normal  mammogram on 01/2021, will reach out to patient regarding scheduling mammogram for this year.   Review of Systems:  Pertinent items noted in HPI and remainder of comprehensive ROS otherwise negative.  Physical Exam:   General:  Alert, oriented and cooperative. Patient appears to be in no acute distress.  Mental Status: Normal mood and affect. Normal behavior. Normal judgment and thought content.   Respiratory: Normal respiratory effort, no problems with respiration noted  Rest of physical exam deferred due to type of encounter  Labs and Imaging No results found for this or any previous visit (from the past 336 hour(s)). No results found.     Assessment and Plan:     Encounter for contraceptive management:      I discussed the assessment and treatment plan with the patient. The patient was provided an opportunity to ask questions and all were answered. The patient agreed with the plan and demonstrated an understanding of the instructions.   The patient was advised to call back or seek an in-person evaluation/go to the ED if the symptoms worsen or if the condition fails to improve as anticipated.  I provided 10 minutes of face-to-face time during this encounter.  F/u 11/2022 for annual exam  Griffin Basil, MD [facility], [place]

## 2022-03-07 ENCOUNTER — Other Ambulatory Visit: Payer: Self-pay | Admitting: *Deleted

## 2022-03-07 DIAGNOSIS — Z1239 Encounter for other screening for malignant neoplasm of breast: Secondary | ICD-10-CM

## 2022-03-07 NOTE — Progress Notes (Signed)
Order for mammogram placed per Dr. Elgie Congo request. TC to pt. No answer. Left HIPAA compliant VM and sent MyChart message.

## 2022-05-16 ENCOUNTER — Ambulatory Visit
Admission: RE | Admit: 2022-05-16 | Discharge: 2022-05-16 | Disposition: A | Discharge status: Home / Self Care | Payer: 59 | Source: Ambulatory Visit | Attending: Obstetrics and Gynecology | Admitting: Obstetrics and Gynecology

## 2022-05-16 DIAGNOSIS — Z1239 Encounter for other screening for malignant neoplasm of breast: Secondary | ICD-10-CM

## 2022-08-21 DIAGNOSIS — M25612 Stiffness of left shoulder, not elsewhere classified: Secondary | ICD-10-CM | POA: Diagnosis not present

## 2022-08-27 DIAGNOSIS — M797 Fibromyalgia: Secondary | ICD-10-CM | POA: Diagnosis not present

## 2022-08-27 DIAGNOSIS — M2550 Pain in unspecified joint: Secondary | ICD-10-CM | POA: Diagnosis not present

## 2022-08-27 DIAGNOSIS — M3500 Sicca syndrome, unspecified: Secondary | ICD-10-CM | POA: Diagnosis not present

## 2022-08-27 DIAGNOSIS — M25562 Pain in left knee: Secondary | ICD-10-CM | POA: Diagnosis not present

## 2022-08-27 DIAGNOSIS — H04129 Dry eye syndrome of unspecified lacrimal gland: Secondary | ICD-10-CM | POA: Diagnosis not present

## 2022-08-28 DIAGNOSIS — M25612 Stiffness of left shoulder, not elsewhere classified: Secondary | ICD-10-CM | POA: Diagnosis not present

## 2022-09-03 DIAGNOSIS — R7989 Other specified abnormal findings of blood chemistry: Secondary | ICD-10-CM | POA: Diagnosis not present

## 2022-09-03 DIAGNOSIS — N182 Chronic kidney disease, stage 2 (mild): Secondary | ICD-10-CM | POA: Diagnosis not present

## 2022-09-03 DIAGNOSIS — M3500 Sicca syndrome, unspecified: Secondary | ICD-10-CM | POA: Diagnosis not present

## 2022-09-03 DIAGNOSIS — Z0000 Encounter for general adult medical examination without abnormal findings: Secondary | ICD-10-CM | POA: Diagnosis not present

## 2022-09-04 DIAGNOSIS — M25512 Pain in left shoulder: Secondary | ICD-10-CM | POA: Diagnosis not present

## 2022-09-04 DIAGNOSIS — M25612 Stiffness of left shoulder, not elsewhere classified: Secondary | ICD-10-CM | POA: Diagnosis not present

## 2022-09-05 DIAGNOSIS — I7300 Raynaud's syndrome without gangrene: Secondary | ICD-10-CM | POA: Diagnosis not present

## 2022-09-05 DIAGNOSIS — M797 Fibromyalgia: Secondary | ICD-10-CM | POA: Diagnosis not present

## 2022-09-05 DIAGNOSIS — M3500 Sicca syndrome, unspecified: Secondary | ICD-10-CM | POA: Diagnosis not present

## 2022-09-05 DIAGNOSIS — Z0000 Encounter for general adult medical examination without abnormal findings: Secondary | ICD-10-CM | POA: Diagnosis not present

## 2022-09-14 DIAGNOSIS — M25512 Pain in left shoulder: Secondary | ICD-10-CM | POA: Diagnosis not present

## 2022-09-14 DIAGNOSIS — M25612 Stiffness of left shoulder, not elsewhere classified: Secondary | ICD-10-CM | POA: Diagnosis not present

## 2022-09-17 DIAGNOSIS — M25512 Pain in left shoulder: Secondary | ICD-10-CM | POA: Diagnosis not present

## 2022-09-17 DIAGNOSIS — M25612 Stiffness of left shoulder, not elsewhere classified: Secondary | ICD-10-CM | POA: Diagnosis not present

## 2022-09-18 ENCOUNTER — Other Ambulatory Visit (HOSPITAL_COMMUNITY): Payer: Self-pay

## 2022-09-18 ENCOUNTER — Telehealth: Payer: Self-pay

## 2022-09-18 NOTE — Telephone Encounter (Signed)
Pharmacy Patient Advocate Encounter  Prior Authorization for Topiramate 25MG  tablets has been APPROVED by [employer] from 09/18/2022 to 09/18/2023.   PA # PA Case ID #: [id number]  Spoke with Pharmacy to process. Copay is $39.10 for a 90DS/540 Tablets per [facility] test claim

## 2022-09-18 NOTE — Telephone Encounter (Signed)
Patient Advocate Encounter   Received notification from [facility] [employer] that prior authorization is required for Topiramate 25MG  tablets   Submitted: 09-18-2022 Key [id number]  Status is pending

## 2022-09-20 DIAGNOSIS — M25512 Pain in left shoulder: Secondary | ICD-10-CM | POA: Diagnosis not present

## 2022-09-20 DIAGNOSIS — M25612 Stiffness of left shoulder, not elsewhere classified: Secondary | ICD-10-CM | POA: Diagnosis not present

## 2022-09-25 DIAGNOSIS — M25512 Pain in left shoulder: Secondary | ICD-10-CM | POA: Diagnosis not present

## 2022-09-25 DIAGNOSIS — M25612 Stiffness of left shoulder, not elsewhere classified: Secondary | ICD-10-CM | POA: Diagnosis not present

## 2022-10-11 DIAGNOSIS — M25612 Stiffness of left shoulder, not elsewhere classified: Secondary | ICD-10-CM | POA: Diagnosis not present

## 2022-10-11 DIAGNOSIS — M25512 Pain in left shoulder: Secondary | ICD-10-CM | POA: Diagnosis not present

## 2022-11-29 DIAGNOSIS — Z4789 Encounter for other orthopedic aftercare: Secondary | ICD-10-CM | POA: Diagnosis not present

## 2023-02-25 NOTE — Progress Notes (Unsigned)
[facility] [address] [place].  [address] [phone]       OFFICE FOLLOW UP VISIT NOTE  [name] Date of Birth:  January 08, 1976 Medical Record Number:  [id number]   Referring MD:  Scheryl Darter  Reason for visit: Migraine headaches   No chief complaint on file.   HPI:   Update 02/25/2023 JM: Patient returns for yearly migraine follow-up visit.  Overall stable, reports ***migraine days per month.  Remains on topiramate 75 mg twice daily and Relpax as needed with benefit and denies side effects.        History provided for reference purposes only Update 02/27/2022 JM: Patient returns for migraine follow-up.  She was previously seen 10 months ago for likely herpes zoster infection, lasted about 7-10 days once treatment started then resolved. No residual pain.   Migraines overall stable, reports about 1 migraine day per 1-2 months. Did have recent increase but believes more from sinus/allergies, has been gradually improving.  Remains on topiramate 75 mg twice daily and Relpax as needed for acute migraines with benefit.   Update 05/02/2021 JM: patient being seen for acute visit for c/o right eye twitching for past 2 weeks and on 1/14 woke up with a sharp shooting pain around left orbital and temple area. Still feels sensitivity in the area such as when she is when she is brushing her hair, left eye pressure sensation and occasional shooting pains in temporal area and ear. Upon seeing her today, evidence of rash in the V1 distribution - she reports this started to appear late last night and worse upon awakening this morning. Denies itching but more painful, burning sensation. Denies any visual loss or changes, vertigo or hearing loss.   Reports lower eye twitching for past 2 weeks - had issue prior but would not last long. Does endorse current flare up of Sjogren's syndrome with increased dry eye. Denies any increased stressors.   Update  01/23/2021 JM: Janet Gibson is being seen for yearly migraine follow-up via MyChart video visit. Overall doing well.  Does report slight increase of migraine headaches during allergy season with 2-3 migraine days per months but other months will experience any.  Continued use of topiramate 75 mg twice daily without side effects.  Continued use of Relpax for emergent relief with benefit.  Recently diagnosed with fibromyalgia and started on duloxetine -routinely followed by rheumatology.  No new concerns at this time.  Update 01/18/2020 JM: Janet Gibson is being seen for migraine follow-up with prior visit 10/2017.  Migraines have been stable since prior visit with ongoing use of Topamax 75 mg twice daily tolerating well without side effects.  Reports migraine occurrence once every 2 months.  She will use Relpax with migraine onset which will help decrease severity and usually prevent migraine pain.  Continued intermittent paresthesias random but very seldom. She remains active and working without difficulty.  no concerns at this time.  10/16/17 UPDATE JM: Patient is being seen today for one-year follow-up for migraines.  She is currently taking Topamax 75 mg twice daily but has been controlling migraines well and she states she may have one every other month.  She states he is can start as being debilitating but will take Relpax and this does help decrease the amount of pain and in order to be functional.  She continues to work full-time and continues to do all previous activities.  Denies further concerns at this time.  03/20/2016 visit PS: She returns for follow-up  after last visit 3 months ago. She stated the Relpax seems to work quite good for her. She responds complete resolution of migraines every time she is taken it. She continues to take Topamax 50 mg twice daily and is tolerating it well without any side effects but states that she has not noted any particular increase benefit from increasing the dose from  25 mg twice daily to 50 mg twice daily. She continued to have intermittent paresthesias which are random. They seem to be more in the left leg and left arm or face but can occur on the right side as well. These usually accompany her headaches but can occur without headaches as well. She has been diagnosed with Sjogren syndrome by rheumatologist Dr. Haydee Salter who started on chloroquine. She did have MRI scan of the brain done on 01/13/16 which I have personally reviewed shows a few nonspecific white matter hyperintensities but no other major abnormalities. I discussed my findings with her. Patient has no other new complaints today.  Initial Consult 12/27/2015 Dr. Pearlean Brownie: 19 year lady whose had history of migraine headaches starting since her 51s. She has noticed increase in frequency and severity of her headaches in the last few years as well as more recently tingling sensations on the left side. She states she woke up on August 18 with a terrible migraine as well as noticed tingling initially in the left cheek. Later on in the morning the tingling involving left shoulder as well as spread to the left leg. She takes ibuprofen 800 mg and usually it helps but on this occasion it did not help. She went to urgent care. She is started on, pending 103 times daily which she has been taking. However she continued to have tingling and numbness off and on lasting about 10 minutes or so. She also developed a few days later pain in the left shoulder as well as spasms and on her back on the upper chest. This has been happening intermittently. Interestingly she states when she gets back spasms her arm tingling disappears. The gabapentin 100 mg 3 times daily has not been helping her. The cramps seem to rotate locations but mostly in the upper back and left shoulder. She does complain of tightness in the neck muscles. Most of her migraine headaches interestingly are located in the back of the head. She denies any loss of vision,  visual scotoma speech problems and dizziness vertigo gait or balance problems. She moved a year ago from [place] to this area. She was seen the neurologist at [facility] [place]. She remembers having had a brain scan done in 2009 which was normal. Recently patient was given a prednisone taper pack by her primary physician but that has not helped either the paresthesia of her headaches. She has tried Imitrex years ago but it did not help her migraine. She currently takes it on Namenda milligrams of ibuprofen. She has also been tried in the past on atenolol as well as amitriptyline for migraine prophylaxis but states it did not help. She has not been on Topamax. She has not tried any of the new triptan medications. She denies any family history of migraines. Denies any significant head injury with loss of consciousness. She does admit to getting fatigued easily but denies any vertigo, diplopia, gait imbalance, bladder urgency or incontinence.     ROS:   14 system review of systems is positive for those listed in HPI and all other systems negative  PMH:  Past Medical History:  Diagnosis Date   Abnormal Pap smear of cervix    Allergy    seasonal   Anxiety    Constipation    Depression    GERD (gastroesophageal reflux disease)    Headache    migraines   Ovarian cyst     Social History:  Social History   Socioeconomic History   Marital status: Married    Spouse name: Not on file   Number of children: Not on file   Years of education: Not on file   Highest education level: Not on file  Occupational History   Not on file  Tobacco Use   Smoking status: Never   Smokeless tobacco: Never  Vaping Use   Vaping status: Never Used  Substance and Sexual Activity   Alcohol use: Yes    Alcohol/week: 4.0 standard drinks of alcohol    Types: 1 Glasses of wine, 3 Standard drinks or equivalent per week    Comment: occ.   Drug use: No   Sexual activity: Yes    Partners: Male     Birth control/protection: Pill  Other Topics Concern   Not on file  Social History Narrative   Not on file   Social Determinants of Health   Financial Resource Strain: Not on file  Food Insecurity: Not on file  Transportation Needs: Not on file  Physical Activity: Not on file  Stress: Not on file  Social Connections: Not on file  Intimate Partner Violence: Not on file    Medications:   Current Outpatient Medications on File Prior to Visit  Medication Sig Dispense Refill   aspirin 81 MG chewable tablet 1 tablet     cetirizine (ZYRTEC) 10 MG tablet Take 10 mg by mouth daily.     docusate sodium (COLACE) 100 MG capsule Take 100 mg by mouth 2 (two) times daily.     eletriptan (RELPAX) 20 MG tablet TAKE ONE TABLET BY MOUTH AS NEEDED FOR MIGRAINE OR HEADACHE. MAY REPEAT IN 2 HOURS IF HEADACHE PRESISTS OR RECURS 10 tablet 11   gabapentin (NEURONTIN) 100 MG capsule Take 200 mg by mouth 3 (three) times daily.     ibuprofen (ADVIL,MOTRIN) 200 MG tablet Take 200 mg by mouth as needed for moderate pain. Reported on 09/19/2015     multivitamin-iron-minerals-folic acid (CENTRUM) chewable tablet Chew 1 tablet by mouth daily. Reported on 07/14/2015     Norethindrone Acetate-Ethinyl Estrad-FE (LOESTRIN 24 FE) 1-20 MG-MCG(24) tablet Take 1 tablet by mouth daily. 28 tablet 11   pilocarpine (SALAGEN) 5 MG tablet Take 5 mg by mouth 2 (two) times daily as needed.     pseudoephedrine (SUDAFED) 30 MG tablet Take 30 mg by mouth every 4 (four) hours as needed for congestion.     topiramate (TOPAMAX) 25 MG tablet Take 3 tablets (75 mg total) by mouth 2 (two) times daily. 540 tablet 3   Current Facility-Administered Medications on File Prior to Visit  Medication Dose Route Frequency Provider Last Rate Last Admin   gadopentetate dimeglumine (MAGNEVIST) injection 12 mL  12 mL Intravenous Once PRN Micki Riley, MD        Allergies:  No Known Allergies  Physical Exam There were no vitals filed for this  visit.   There is no height or weight on file to calculate BMI.  General: well developed, well nourished, very pleasant middle-age Caucasian female, seated, in no evident distress Head: head normocephalic and atraumatic. Slight twitching under right eye. Normal appearance  of left and right eyes   Neck: supple with no carotid or supraclavicular bruits Cardiovascular: regular rate and rhythm, no murmurs Musculoskeletal: no deformity Skin:  erythematous papules along V1 distribution, no evidence of vesicles in left ear Vascular:  Normal pulses all extremities   Neurologic Exam Mental Status: Awake and fully alert. Oriented to place and time. Recent and remote memory intact. Attention span, concentration and fund of knowledge appropriate. Mood and affect appropriate.  Cranial Nerves: Pupils equal, briskly reactive to light. Extraocular movements full without nystagmus. Visual fields full to confrontation. Hearing intact. Facial sensation intact. tongue, palate moves normally and symmetrically. left facial left nasolabial fold flattening Motor: Normal bulk and tone. Normal strength in all tested extremity muscles Sensory.: intact to touch , pinprick , position and vibratory sensation.  Coordination: Rapid alternating movements normal in all extremities. Finger-to-nose and heel-to-shin performed accurately bilaterally. Gait and Station: Arises from chair without difficulty. Stance is normal. Gait demonstrates normal stride length and balance.  Reflexes: 1+ and symmetric. Toes downgoing.       ASSESSMENT/PLAN: 31 year lady with long-standing history of migraine headaches with prior increase in  frequency, severity as well as intermittent left body paresthesias of unclear etiology.  Migraine headaches greatly improved with use of topiramate.  History of herpes zoster 04/2021 resolved after valacyclovir and prednisone   -Continue topiramate 75 mg twice daily -refill provided -Continue Relpax 20  mg as needed for emergent relief -refill provided   Follow-up in 1 year or call earlier if needed    CC:  Georgianne Fick, MD     I spent 24 minutes of face-to-face and non-face-to-face time with patient.  This included previsit chart review, lab review, order entry, electronic health record documentation, patient education and discussion regarding migraine headaches and current treatment plan and answered all the questions to patient satisfaction   Ihor Austin, [facility]  [facility] [address] [place], [place] [address]  Phone [phone] Fax [phone] Note: This document was prepared with digital dictation and possible smart phrase technology. Any transcriptional errors that result from this process are unintentional.

## 2023-02-26 ENCOUNTER — Encounter: Payer: Self-pay | Admitting: Adult Health

## 2023-02-26 ENCOUNTER — Ambulatory Visit: Payer: BC Managed Care – PPO | Admitting: Adult Health

## 2023-02-26 VITALS — BP 136/90 | HR 65 | Ht 64.0 in | Wt 138.0 lb

## 2023-02-26 DIAGNOSIS — G43009 Migraine without aura, not intractable, without status migrainosus: Secondary | ICD-10-CM

## 2023-02-26 DIAGNOSIS — R519 Headache, unspecified: Secondary | ICD-10-CM

## 2023-02-26 DIAGNOSIS — R413 Other amnesia: Secondary | ICD-10-CM | POA: Diagnosis not present

## 2023-02-26 DIAGNOSIS — H04123 Dry eye syndrome of bilateral lacrimal glands: Secondary | ICD-10-CM | POA: Diagnosis not present

## 2023-02-26 DIAGNOSIS — H524 Presbyopia: Secondary | ICD-10-CM | POA: Diagnosis not present

## 2023-02-26 MED ORDER — ELETRIPTAN HYDROBROMIDE 20 MG PO TABS: ORAL_TABLET | ORAL | 11 refills | Status: DC

## 2023-02-26 MED ORDER — TOPIRAMATE 25 MG PO TABS: 75.0000 mg | ORAL_TABLET | Freq: Two times a day (BID) | ORAL | 3 refills | Status: DC

## 2023-02-26 MED ORDER — AMITRIPTYLINE HCL 10 MG PO TABS: 20.0000 mg | ORAL_TABLET | Freq: Every day | ORAL | 5 refills | Status: DC

## 2023-02-26 NOTE — Patient Instructions (Addendum)
Your Plan:  Start amitriptyline 10 mg nightly for the first week then increase to 20 mg daily - please call or send MyChart message after 2-3 weeks if no benefit or sooner if difficulty tolerating  Continue topiramate 75mg  twice daily for migraine prevention   Continue Relpax as needed  You will be called to schedule MRI brain to ensure no worrisome findings contributing to worsening headaches and difficulties to numbers/words      Follow-up in 6 months or call earlier if needed     Thank you for coming to see [place] at [facility]. I hope we have been able to provide you high quality care today.  You may receive a patient satisfaction survey over the next few weeks. We would appreciate your feedback and comments so that we may continue to improve ourselves and the health of our patients.

## 2023-03-04 DIAGNOSIS — M797 Fibromyalgia: Secondary | ICD-10-CM | POA: Diagnosis not present

## 2023-03-04 DIAGNOSIS — M3500 Sicca syndrome, unspecified: Secondary | ICD-10-CM | POA: Diagnosis not present

## 2023-03-04 DIAGNOSIS — M545 Low back pain, unspecified: Secondary | ICD-10-CM | POA: Diagnosis not present

## 2023-03-04 DIAGNOSIS — M2550 Pain in unspecified joint: Secondary | ICD-10-CM | POA: Diagnosis not present

## 2023-03-04 DIAGNOSIS — H04129 Dry eye syndrome of unspecified lacrimal gland: Secondary | ICD-10-CM | POA: Diagnosis not present

## 2023-03-06 ENCOUNTER — Ambulatory Visit: Payer: BC Managed Care – PPO

## 2023-03-06 DIAGNOSIS — G43009 Migraine without aura, not intractable, without status migrainosus: Secondary | ICD-10-CM | POA: Diagnosis not present

## 2023-03-06 DIAGNOSIS — R413 Other amnesia: Secondary | ICD-10-CM

## 2023-03-06 DIAGNOSIS — R519 Headache, unspecified: Secondary | ICD-10-CM

## 2023-03-06 MED ORDER — GADOBENATE DIMEGLUMINE 529 MG/ML IV SOLN
10.0000 mL | Freq: Once | INTRAVENOUS | Status: AC | PRN
  Administered 2023-03-06: 10 mL via INTRAVENOUS

## 2023-06-03 DIAGNOSIS — M5416 Radiculopathy, lumbar region: Secondary | ICD-10-CM | POA: Diagnosis not present

## 2023-06-03 DIAGNOSIS — D225 Melanocytic nevi of trunk: Secondary | ICD-10-CM | POA: Diagnosis not present

## 2023-06-03 DIAGNOSIS — D2262 Melanocytic nevi of left upper limb, including shoulder: Secondary | ICD-10-CM | POA: Diagnosis not present

## 2023-06-03 DIAGNOSIS — L821 Other seborrheic keratosis: Secondary | ICD-10-CM | POA: Diagnosis not present

## 2023-06-03 DIAGNOSIS — D171 Benign lipomatous neoplasm of skin and subcutaneous tissue of trunk: Secondary | ICD-10-CM | POA: Diagnosis not present

## 2023-06-12 DIAGNOSIS — M545 Low back pain, unspecified: Secondary | ICD-10-CM | POA: Diagnosis not present

## 2023-06-12 DIAGNOSIS — M2560 Stiffness of unspecified joint, not elsewhere classified: Secondary | ICD-10-CM | POA: Diagnosis not present

## 2023-06-13 ENCOUNTER — Encounter: Payer: Self-pay | Admitting: Adult Health

## 2023-06-17 DIAGNOSIS — M2560 Stiffness of unspecified joint, not elsewhere classified: Secondary | ICD-10-CM | POA: Diagnosis not present

## 2023-06-17 DIAGNOSIS — M545 Low back pain, unspecified: Secondary | ICD-10-CM | POA: Diagnosis not present

## 2023-07-03 DIAGNOSIS — M2560 Stiffness of unspecified joint, not elsewhere classified: Secondary | ICD-10-CM | POA: Diagnosis not present

## 2023-07-03 DIAGNOSIS — M545 Low back pain, unspecified: Secondary | ICD-10-CM | POA: Diagnosis not present

## 2023-07-10 DIAGNOSIS — M545 Low back pain, unspecified: Secondary | ICD-10-CM | POA: Diagnosis not present

## 2023-07-10 DIAGNOSIS — M2560 Stiffness of unspecified joint, not elsewhere classified: Secondary | ICD-10-CM | POA: Diagnosis not present

## 2023-07-15 DIAGNOSIS — M5416 Radiculopathy, lumbar region: Secondary | ICD-10-CM | POA: Diagnosis not present

## 2023-07-17 DIAGNOSIS — M2560 Stiffness of unspecified joint, not elsewhere classified: Secondary | ICD-10-CM | POA: Diagnosis not present

## 2023-07-17 DIAGNOSIS — M545 Low back pain, unspecified: Secondary | ICD-10-CM | POA: Diagnosis not present

## 2023-07-24 DIAGNOSIS — M545 Low back pain, unspecified: Secondary | ICD-10-CM | POA: Diagnosis not present

## 2023-07-24 DIAGNOSIS — M2560 Stiffness of unspecified joint, not elsewhere classified: Secondary | ICD-10-CM | POA: Diagnosis not present

## 2023-08-06 DIAGNOSIS — M545 Low back pain, unspecified: Secondary | ICD-10-CM | POA: Diagnosis not present

## 2023-08-13 DIAGNOSIS — M5416 Radiculopathy, lumbar region: Secondary | ICD-10-CM | POA: Diagnosis not present

## 2023-08-13 DIAGNOSIS — M47816 Spondylosis without myelopathy or radiculopathy, lumbar region: Secondary | ICD-10-CM | POA: Diagnosis not present

## 2023-08-20 DIAGNOSIS — M5416 Radiculopathy, lumbar region: Secondary | ICD-10-CM | POA: Diagnosis not present

## 2023-08-22 NOTE — Progress Notes (Deleted)
 [facility] [address] [place]. [place] [address] [phone]       OFFICE FOLLOW UP VISIT NOTE  Janet Gibson Date of Birth:  01-Dec-1975 Medical Record Number:  [id number]   Referring MD:  Onnie Bilis  Reason for visit: Migraine headaches  Virtual Visit via Video Note  I connected with Janet Gibson on 08/22/23 at  7:45 AM EDT by a video enabled telemedicine application and verified that I am speaking with the correct person using two identifiers.  Location: Patient: *** Provider: ***   I discussed the limitations of evaluation and management by telemedicine and the availability of in person appointments. The patient expressed understanding and agreed to proceed.     No chief complaint on file.   HPI:   Update 08/26/2023 JM: patient returns for follow up visit via MyChart VV.    MRI brain 02/2023 without acute abnormalities     History provided for reference purposes only Update 02/25/2023 JM: Patient returns for yearly migraine follow-up visit.  Reports worsening of migraine headaches over the past couple months, having about 1-2 migraines per week, has also been experiencing bilateral hand tingling around the time of her migraine lasting about 10-15 minutes, she has not experienced this type of symptom in over 10 years. She has also noticed some short term memory difficulties, also has been mixing up numbers and difficulty remembering numbers if presented verbally, also mixing up similar words such as trials and trails. She is unsure if this started with worsening headaches or just more noticeable.  She does believe she is starting menopause as she has occasional hot flashes and irregular menses (either spotting or does not get menses).  She remains on topiramate  75 mg twice daily for prevention, continues on Relpax  as needed with benefit.  She is currently on gabapentin  for fibromyalgia.  Update 02/27/2022 JM: Patient returns for  migraine follow-up.  She was previously seen 10 months ago for likely herpes zoster infection, lasted about 7-10 days once treatment started then resolved. No residual pain.   Migraines overall stable, reports about 1 migraine day per 1-2 months. Did have recent increase but believes more from sinus/allergies, has been gradually improving.  Remains on topiramate  75 mg twice daily and Relpax  as needed for acute migraines with benefit.   Update 05/02/2021 JM: patient being seen for acute visit for c/o right eye twitching for past 2 weeks and on 1/14 woke up with a sharp shooting pain around left orbital and temple area. Still feels sensitivity in the area such as when she is when she is brushing her hair, left eye pressure sensation and occasional shooting pains in temporal area and ear. Upon seeing her today, evidence of rash in the V1 distribution - she reports this started to appear late last night and worse upon awakening this morning. Denies itching but more painful, burning sensation. Denies any visual loss or changes, vertigo or hearing loss.   Reports lower eye twitching for past 2 weeks - had issue prior but would not last long. Does endorse current flare up of Sjogren's syndrome with increased dry eye. Denies any increased stressors.   Update 01/23/2021 JM: Janet Gibson is being seen for yearly migraine follow-up via MyChart video visit. Overall doing well.  Does report slight increase of migraine headaches during allergy season with 2-3 migraine days per months but other months will experience any.  Continued use of topiramate  75 mg twice daily without side effects.  Continued use of Relpax   for emergent relief with benefit.  Recently diagnosed with fibromyalgia and started on duloxetine -routinely followed by rheumatology.  No new concerns at this time.  Update 01/18/2020 JM: Janet Gibson is being seen for migraine follow-up with prior visit 10/2017.  Migraines have been stable since prior visit with  ongoing use of Topamax  75 mg twice daily tolerating well without side effects.  Reports migraine occurrence once every 2 months.  She will use Relpax  with migraine onset which will help decrease severity and usually prevent migraine pain.  Continued intermittent paresthesias random but very seldom. She remains active and working without difficulty.  no concerns at this time.  10/16/17 UPDATE JM: Patient is being seen today for one-year follow-up for migraines.  She is currently taking Topamax  75 mg twice daily but has been controlling migraines well and she states she may have one every other month.  She states he is can start as being debilitating but will take Relpax  and this does help decrease the amount of pain and in order to be functional.  She continues to work full-time and continues to do all previous activities.  Denies further concerns at this time.  03/20/2016 visit PS: She returns for follow-up after last visit 3 months ago. She stated the Relpax  seems to work quite good for her. She responds complete resolution of migraines every time she is taken it. She continues to take Topamax  50 mg twice daily and is tolerating it well without any side effects but states that she has not noted any particular increase benefit from increasing the dose from 25 mg twice daily to 50 mg twice daily. She continued to have intermittent paresthesias which are random. They seem to be more in the left leg and left arm or face but can occur on the right side as well. These usually accompany her headaches but can occur without headaches as well. She has been diagnosed with Sjogren syndrome by rheumatologist Dr. Debe Falco who started on chloroquine. She did have MRI scan of the brain done on 01/13/16 which I have personally reviewed shows a few nonspecific white matter hyperintensities but no other major abnormalities. I discussed my findings with her. Patient has no other new complaints today.  Initial Consult 12/27/2015  Dr. Janett Medin: 67 year lady whose had history of migraine headaches starting since her 43s. She has noticed increase in frequency and severity of her headaches in the last few years as well as more recently tingling sensations on the left side. She states she woke up on August 18 with a terrible migraine as well as noticed tingling initially in the left cheek. Later on in the morning the tingling involving left shoulder as well as spread to the left leg. She takes ibuprofen  800 mg and usually it helps but on this occasion it did not help. She went to urgent care. She is started on, pending 103 times daily which she has been taking. However she continued to have tingling and numbness off and on lasting about 10 minutes or so. She also developed a few days later pain in the left shoulder as well as spasms and on her back on the upper chest. This has been happening intermittently. Interestingly she states when she gets back spasms her arm tingling disappears. The gabapentin  100 mg 3 times daily has not been helping her. The cramps seem to rotate locations but mostly in the upper back and left shoulder. She does complain of tightness in the neck muscles. Most of her  migraine headaches interestingly are located in the back of the head. She denies any loss of vision, visual scotoma speech problems and dizziness vertigo gait or balance problems. She moved a year ago from [place] to this area. She was seen the neurologist at [facility] [place]. She remembers having had a brain scan done in 2009 which was normal. Recently patient was given a prednisone  taper pack by her primary physician but that has not helped either the paresthesia of her headaches. She has tried Imitrex years ago but it did not help her migraine. She currently takes it on Namenda milligrams of ibuprofen . She has also been tried in the past on atenolol as well as amitriptyline  for migraine prophylaxis but states it did not help. She has not  been on Topamax . She has not tried any of the new triptan medications. She denies any family history of migraines. Denies any significant head injury with loss of consciousness. She does admit to getting fatigued easily but denies any vertigo, diplopia, gait imbalance, bladder urgency or incontinence.     ROS:   14 system review of systems is positive for those listed in HPI and all other systems negative  PMH:  Past Medical History:  Diagnosis Date   Abnormal Pap smear of cervix    Allergy    seasonal   Anxiety    Constipation    Depression    GERD (gastroesophageal reflux disease)    Headache    migraines   Ovarian cyst     Social History:  Social History   Socioeconomic History   Marital status: Married    Spouse name: Not on file   Number of children: Not on file   Years of education: Not on file   Highest education level: Not on file  Occupational History   Not on file  Tobacco Use   Smoking status: Never   Smokeless tobacco: Never  Vaping Use   Vaping status: Never Used  Substance and Sexual Activity   Alcohol use: Yes    Alcohol/week: 4.0 standard drinks of alcohol    Types: 1 Glasses of wine, 3 Standard drinks or equivalent per week    Comment: occ.   Drug use: No   Sexual activity: Yes    Partners: Male    Birth control/protection: Pill  Other Topics Concern   Not on file  Social History Narrative   Not on file   Social Drivers of Health   Financial Resource Strain: Not on file  Food Insecurity: Not on file  Transportation Needs: Not on file  Physical Activity: Not on file  Stress: Not on file  Social Connections: Not on file  Intimate Partner Violence: Not on file    Medications:   Current Outpatient Medications on File Prior to Visit  Medication Sig Dispense Refill   amitriptyline  (ELAVIL ) 10 MG tablet Take 2 tablets (20 mg total) by mouth at bedtime. 60 tablet 5   aspirin 81 MG chewable tablet 1 tablet     cetirizine (ZYRTEC) 10 MG  tablet Take 10 mg by mouth daily.     docusate sodium (COLACE) 100 MG capsule Take 100 mg by mouth 2 (two) times daily.     eletriptan  (RELPAX ) 20 MG tablet TAKE ONE TABLET BY MOUTH AS NEEDED FOR MIGRAINE OR HEADACHE. MAY REPEAT IN 2 HOURS IF HEADACHE PRESISTS OR RECURS 10 tablet 11   gabapentin  (NEURONTIN ) 100 MG capsule Take 200 mg by mouth 3 (three) times daily.  ibuprofen  (ADVIL ,MOTRIN ) 200 MG tablet Take 200 mg by mouth as needed for moderate pain. Reported on 09/19/2015     multivitamin-iron-minerals-folic acid (CENTRUM) chewable tablet Chew 1 tablet by mouth daily. Reported on 07/14/2015     Norethindrone Acetate-Ethinyl Estrad-FE (LOESTRIN  24 FE) 1-20 MG-MCG(24) tablet Take 1 tablet by mouth daily. 28 tablet 11   pseudoephedrine (SUDAFED) 30 MG tablet Take 30 mg by mouth every 4 (four) hours as needed for congestion.     topiramate  (TOPAMAX ) 25 MG tablet Take 3 tablets (75 mg total) by mouth 2 (two) times daily. 540 tablet 3   No current facility-administered medications on file prior to visit.    Allergies:  No Known Allergies  Physical Exam There were no vitals filed for this visit.  There is no height or weight on file to calculate BMI.  General: well developed, well nourished, very pleasant middle-age Caucasian female, seated, in no evident distress  Neurologic Exam Mental Status: Awake and fully alert.  Fluent speech and language.  Oriented to place and time. Recent and remote memory intact. Attention span, concentration and fund of knowledge appropriate. Mood and affect appropriate.  Cranial Nerves: Pupils equal, briskly reactive to light. Extraocular movements full without nystagmus. Visual fields full to confrontation. Hearing intact. Facial sensation intact. tongue, palate moves normally and symmetrically. left facial left nasolabial fold flattening Motor: Normal bulk and tone. Normal strength in all tested extremity muscles Sensory.: intact to touch , pinprick , position  and vibratory sensation.  Coordination: Rapid alternating movements normal in all extremities. Finger-to-nose and heel-to-shin performed accurately bilaterally. Gait and Station: Arises from chair without difficulty. Stance is normal. Gait demonstrates normal stride length and balance.  Reflexes: 1+ and symmetric. Toes downgoing.       ASSESSMENT/PLAN: 41 year lady with long-standing history of migraine headaches with prior increase in  frequency, severity as well as intermittent left body paresthesias of unclear etiology in 2017.  Migraine headaches greatly improved with use of topiramate  but now with worsening of migraines over the past 2 months with occasional bilateral hand tingling and more noticeable short term memory difficulties and mixing up numbers and words.  History of herpes zoster 04/2021 resolved after valacyclovir  and prednisone     -discussed worsening of migraines possibly weather related vs hormonal (believes starting menopause), memory changes can be present with worsening migraines -recommend completion of MRI brain w/wo contrast due to worsening migraines now with bilateral hand tingling and cognitive changes to rule out vascular etiology although low suspicion -Reports routine lab work by PCP and rheumatology, recommend checking TSH and vitamin B12 if not recently completed (unable to view labs via epic) -recommend starting amitriptyline  10 mg nightly for 7 days and increase to 20 mg nightly -Continue topiramate  75 mg twice daily, hesitant to increase further as this potentially could impact memory -Continue Relpax  as needed -If no benefit or intolerant to amitriptyline , consider CGRP  -previously tried/failed: Gabapentin , atenolol, duloxetine    Follow-up in 6 months or call earlier if needed    CC:  Virgle Grime, MD     I spent 40 minutes of face-to-face and non-face-to-face time with patient.  This included previsit chart review, lab review, order  entry, electronic health record documentation, patient education and discussion regarding migraine headaches and current treatment plan and answered all the questions to patient satisfaction  Johny Nap, [facility]  [facility] [address] [place], [place] [address]  Phone [phone] Fax [phone] Note: This document was prepared  with digital dictation and possible smart phrase technology. Any transcriptional errors that result from this process are unintentional.

## 2023-08-26 ENCOUNTER — Telehealth: Payer: BC Managed Care – PPO | Admitting: Adult Health

## 2023-08-26 NOTE — Progress Notes (Unsigned)
 [facility] [address] [place]. [place] [address] [phone]       OFFICE FOLLOW UP VISIT NOTE  [name] Date of Birth:  Nov 22, 1975 Medical Record Number:  [id number]   Referring MD:  Onnie Bilis  Reason for visit: Migraine headaches  Virtual Visit via Video Note  I connected with Janet Gibson on 08/27/2023 at  8:15 AM EDT by a video enabled telemedicine application and verified that I am speaking with the correct person using two identifiers.  Location: Patient: at home Provider: in office, [facility]   I discussed the limitations of evaluation and management by telemedicine and the availability of in person appointments. The patient expressed understanding and agreed to proceed.      HPI:   Update 08/26/2023 JM: patient returns for follow up visit via MyChart VV.  Previously reported worsening migraines about 1 to 2/week associated with bilateral hand tingling.  Initiated amitriptyline  gradually to 20 mg nightly at prior visit and continued topiramate  75 mg twice daily.  Also recommended completion of MRI brain which was normal without acute abnormalities.  Currently, she reports improvement of migraines since initiating amitriptyline , currently taking 20 mg nightly as well as ongoing use of topiramate .  Reports having about 1 migraine per month.  Use of Relpax  with continued benefit.  No questions or concerns at this time    History provided for reference purposes only Update 02/25/2023 JM: Patient returns for yearly migraine follow-up visit.  Reports worsening of migraine headaches over the past couple months, having about 1-2 migraines per week, has also been experiencing bilateral hand tingling around the time of her migraine lasting about 10-15 minutes, she has not experienced this type of symptom in over 10 years. She has also noticed some short term memory difficulties, also has been mixing up numbers and difficulty remembering numbers  if presented verbally, also mixing up similar words such as trials and trails. She is unsure if this started with worsening headaches or just more noticeable.  She does believe she is starting menopause as she has occasional hot flashes and irregular menses (either spotting or does not get menses).  She remains on topiramate  75 mg twice daily for prevention, continues on Relpax  as needed with benefit.  She is currently on gabapentin  for fibromyalgia.  Update 02/27/2022 JM: Patient returns for migraine follow-up.  She was previously seen 10 months ago for likely herpes zoster infection, lasted about 7-10 days once treatment started then resolved. No residual pain.   Migraines overall stable, reports about 1 migraine day per 1-2 months. Did have recent increase but believes more from sinus/allergies, has been gradually improving.  Remains on topiramate  75 mg twice daily and Relpax  as needed for acute migraines with benefit.   Update 05/02/2021 JM: patient being seen for acute visit for c/o right eye twitching for past 2 weeks and on 1/14 woke up with a sharp shooting pain around left orbital and temple area. Still feels sensitivity in the area such as when she is when she is brushing her hair, left eye pressure sensation and occasional shooting pains in temporal area and ear. Upon seeing her today, evidence of rash in the V1 distribution - she reports this started to appear late last night and worse upon awakening this morning. Denies itching but more painful, burning sensation. Denies any visual loss or changes, vertigo or hearing loss.   Reports lower eye twitching for past 2 weeks - had issue prior but would not last long.  Does endorse current flare up of Sjogren's syndrome with increased dry eye. Denies any increased stressors.   Update 01/23/2021 JM: [name] is being seen for yearly migraine follow-up via MyChart video visit. Overall doing well.  Does report slight increase of migraine headaches  during allergy season with 2-3 migraine days per months but other months will experience any.  Continued use of topiramate  75 mg twice daily without side effects.  Continued use of Relpax  for emergent relief with benefit.  Recently diagnosed with fibromyalgia and started on duloxetine -routinely followed by rheumatology.  No new concerns at this time.  Update 01/18/2020 JM: [name] is being seen for migraine follow-up with prior visit 10/2017.  Migraines have been stable since prior visit with ongoing use of Topamax  75 mg twice daily tolerating well without side effects.  Reports migraine occurrence once every 2 months.  She will use Relpax  with migraine onset which will help decrease severity and usually prevent migraine pain.  Continued intermittent paresthesias random but very seldom. She remains active and working without difficulty.  no concerns at this time.  10/16/17 UPDATE JM: Patient is being seen today for one-year follow-up for migraines.  She is currently taking Topamax  75 mg twice daily but has been controlling migraines well and she states she may have one every other month.  She states he is can start as being debilitating but will take Relpax  and this does help decrease the amount of pain and in order to be functional.  She continues to work full-time and continues to do all previous activities.  Denies further concerns at this time.  03/20/2016 visit PS: She returns for follow-up after last visit 3 months ago. She stated the Relpax  seems to work quite good for her. She responds complete resolution of migraines every time she is taken it. She continues to take Topamax  50 mg twice daily and is tolerating it well without any side effects but states that she has not noted any particular increase benefit from increasing the dose from 25 mg twice daily to 50 mg twice daily. She continued to have intermittent paresthesias which are random. They seem to be more in the left leg and left arm or face but  can occur on the right side as well. These usually accompany her headaches but can occur without headaches as well. She has been diagnosed with Sjogren syndrome by rheumatologist Dr. Debe Falco who started on chloroquine. She did have MRI scan of the brain done on 01/13/16 which I have personally reviewed shows a few nonspecific white matter hyperintensities but no other major abnormalities. I discussed my findings with her. Patient has no other new complaints today.  Initial Consult 12/27/2015 Dr. Janett Medin: 10 year lady whose had history of migraine headaches starting since her 60s. She has noticed increase in frequency and severity of her headaches in the last few years as well as more recently tingling sensations on the left side. She states she woke up on August 18 with a terrible migraine as well as noticed tingling initially in the left cheek. Later on in the morning the tingling involving left shoulder as well as spread to the left leg. She takes ibuprofen  800 mg and usually it helps but on this occasion it did not help. She went to urgent care. She is started on, pending 103 times daily which she has been taking. However she continued to have tingling and numbness off and on lasting about 10 minutes or so. She also developed  a few days later pain in the left shoulder as well as spasms and on her back on the upper chest. This has been happening intermittently. Interestingly she states when she gets back spasms her arm tingling disappears. The gabapentin  100 mg 3 times daily has not been helping her. The cramps seem to rotate locations but mostly in the upper back and left shoulder. She does complain of tightness in the neck muscles. Most of her migraine headaches interestingly are located in the back of the head. She denies any loss of vision, visual scotoma speech problems and dizziness vertigo gait or balance problems. She moved a year ago from [place] to this area. She was seen the neurologist at  [facility] of [place]. She remembers having had a brain scan done in 2009 which was normal. Recently patient was given a prednisone  taper pack by her primary physician but that has not helped either the paresthesia of her headaches. She has tried Imitrex years ago but it did not help her migraine. She currently takes it on Namenda milligrams of ibuprofen . She has also been tried in the past on atenolol as well as amitriptyline  for migraine prophylaxis but states it did not help. She has not been on Topamax . She has not tried any of the new triptan medications. She denies any family history of migraines. Denies any significant head injury with loss of consciousness. She does admit to getting fatigued easily but denies any vertigo, diplopia, gait imbalance, bladder urgency or incontinence.     ROS:   14 system review of systems is positive for those listed in HPI and all other systems negative  PMH:  Past Medical History:  Diagnosis Date   Abnormal Pap smear of cervix    Allergy    seasonal   Anxiety    Constipation    Depression    GERD (gastroesophageal reflux disease)    Headache    migraines   Ovarian cyst     Social History:  Social History   Socioeconomic History   Marital status: Married    Spouse name: Not on file   Number of children: Not on file   Years of education: Not on file   Highest education level: Not on file  Occupational History   Not on file  Tobacco Use   Smoking status: Never   Smokeless tobacco: Never  Vaping Use   Vaping status: Never Used  Substance and Sexual Activity   Alcohol use: Yes    Alcohol/week: 4.0 standard drinks of alcohol    Types: 1 Glasses of wine, 3 Standard drinks or equivalent per week    Comment: occ.   Drug use: No   Sexual activity: Yes    Partners: Male    Birth control/protection: Pill  Other Topics Concern   Not on file  Social History Narrative   Not on file   Social Drivers of Health   Financial  Resource Strain: Not on file  Food Insecurity: Not on file  Transportation Needs: Not on file  Physical Activity: Not on file  Stress: Not on file  Social Connections: Not on file  Intimate Partner Violence: Not on file    Medications:   Current Outpatient Medications on File Prior to Visit  Medication Sig Dispense Refill   aspirin 81 MG chewable tablet 1 tablet     cetirizine (ZYRTEC) 10 MG tablet Take 10 mg by mouth daily.     docusate sodium (COLACE) 100 MG capsule Take 100  mg by mouth 2 (two) times daily.     gabapentin  (NEURONTIN ) 100 MG capsule Take 200 mg by mouth 3 (three) times daily.     ibuprofen  (ADVIL ,MOTRIN ) 200 MG tablet Take 200 mg by mouth as needed for moderate pain. Reported on 09/19/2015     multivitamin-iron-minerals-folic acid (CENTRUM) chewable tablet Chew 1 tablet by mouth daily. Reported on 07/14/2015     Norethindrone Acetate-Ethinyl Estrad-FE (LOESTRIN  24 FE) 1-20 MG-MCG(24) tablet Take 1 tablet by mouth daily. 28 tablet 11   pseudoephedrine (SUDAFED) 30 MG tablet Take 30 mg by mouth every 4 (four) hours as needed for congestion.     No current facility-administered medications on file prior to visit.    Allergies:  No Known Allergies  Physical Exam General: well developed, well nourished, very pleasant middle-age Caucasian female, seated, in no evident distress  Neurologic Exam Mental Status: Awake and fully alert.  Fluent speech and language.  Oriented to place and time. Recent and remote memory intact. Attention span, concentration and fund of knowledge appropriate. Mood and affect appropriate.       ASSESSMENT/PLAN: 17 year lady with long-standing history of migraine headaches with prior increase in  frequency, severity as well as intermittent left body paresthesias of unclear etiology in 2017.  Migraine headaches greatly improved with use of topiramate  but c/o worsening migraines around 12/2022 with occasional bilateral hand tingling and more  noticeable short term memory difficulties and mixing up numbers and words. MR brain 02/2023 benign. Migraines improved after adding amitriptyline , currently about 1/month.  History of herpes zoster 04/2021 resolved after valacyclovir  and prednisone     -Recommend continuation of amitriptyline  20 mg nightly and topiramate  75 mg twice daily.  Refills provided.  Can discuss gradually weaning off 1 of these medications follow-up visit if migraines remain well-controlled. - Continue Relpax  as needed -refill provided -Next steps: consider CGRP  -previously tried/failed: Gabapentin , atenolol, duloxetine, topiramate , amitriptyline      Follow-up in 6 months or call earlier if needed    CC:  Virgle Grime, MD     I spent 15 minutes of face-to-face and non-face-to-face time with patient via MyChart VV.  This included previsit chart review, lab review, order entry, electronic health record documentation, patient education and discussion regarding migraine headaches and current treatment plan and answered all the questions to patient satisfaction  Johny Nap, [facility]  [facility] [address] [place], [place] [address]  Phone [phone] Fax [phone] Note: This document was prepared with digital dictation and possible smart phrase technology. Any transcriptional errors that result from this process are unintentional.

## 2023-08-27 ENCOUNTER — Encounter: Payer: Self-pay | Admitting: Adult Health

## 2023-08-27 ENCOUNTER — Telehealth: Admitting: Adult Health

## 2023-08-27 DIAGNOSIS — G43009 Migraine without aura, not intractable, without status migrainosus: Secondary | ICD-10-CM | POA: Diagnosis not present

## 2023-08-27 MED ORDER — ELETRIPTAN HYDROBROMIDE 20 MG PO TABS: ORAL_TABLET | ORAL | 11 refills | Status: AC

## 2023-08-27 MED ORDER — AMITRIPTYLINE HCL 10 MG PO TABS: 20.0000 mg | ORAL_TABLET | Freq: Every day | ORAL | 3 refills | Status: AC

## 2023-08-27 MED ORDER — TOPIRAMATE 25 MG PO TABS: 75.0000 mg | ORAL_TABLET | Freq: Two times a day (BID) | ORAL | 3 refills | Status: DC

## 2023-08-29 IMAGING — MG MM DIGITAL SCREENING BILAT W/ TOMO AND CAD
8 series · 9 of 24 positions shown · non-contrast
Comparison: Previous exam(s).

CLINICAL DATA: Screening.

EXAM:
DIGITAL SCREENING BILATERAL MAMMOGRAM WITH TOMOSYNTHESIS AND CAD
TECHNIQUE: Bilateral screening digital craniocaudal and mediolateral oblique
mammograms were obtained. Bilateral screening digital breast
tomosynthesis was performed. The images were evaluated with
computer-aided detection.

[L MLO synth-2D]
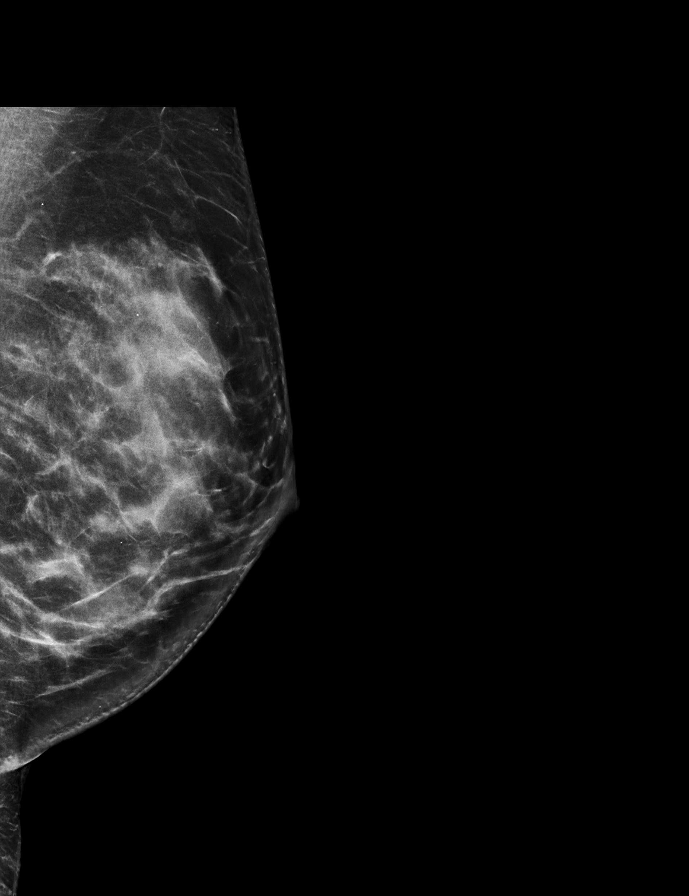

[R CC synth-2D]
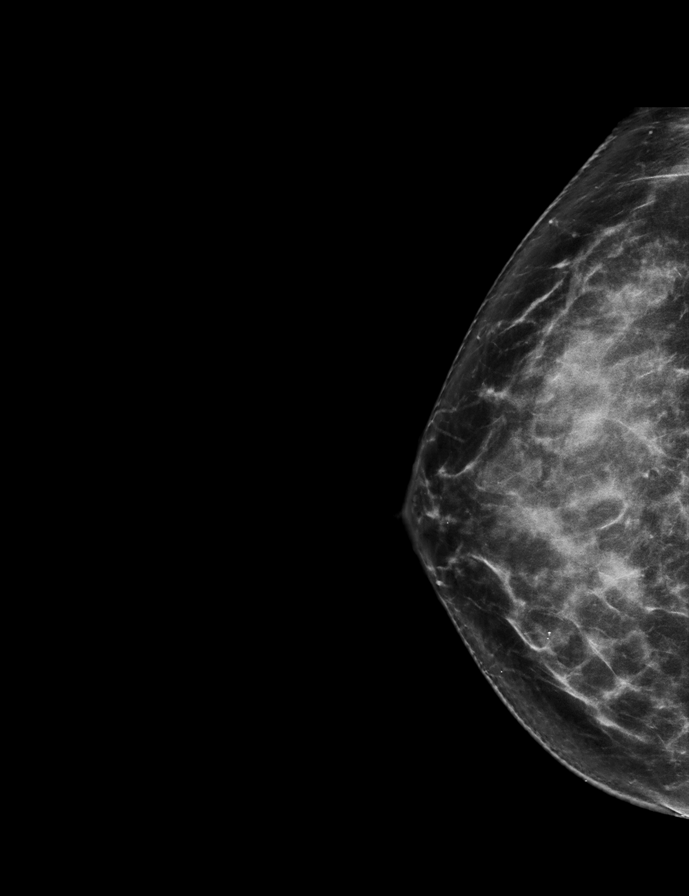

[R MLO synth-2D]
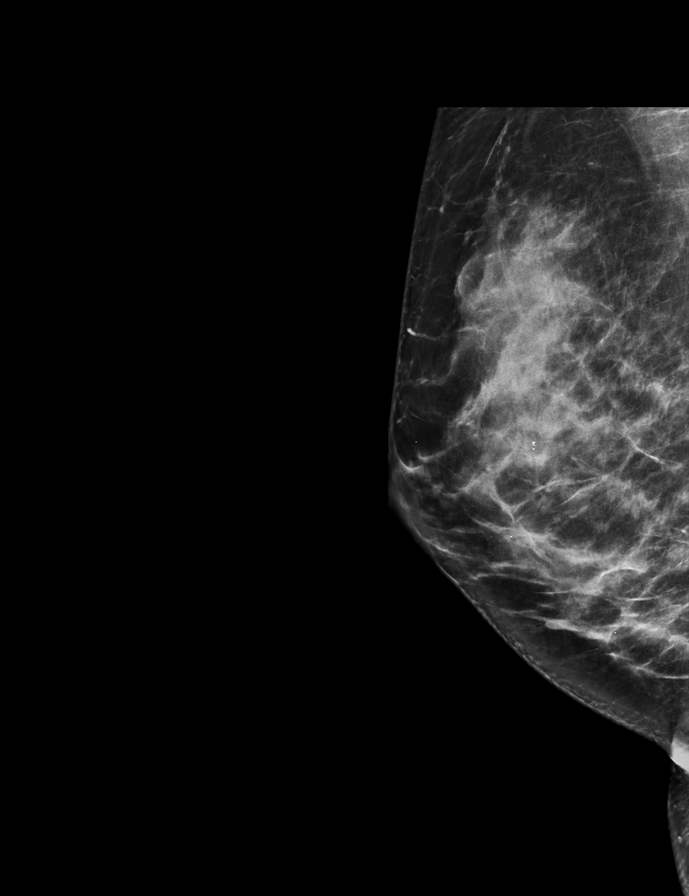

[L CC synth-2D]
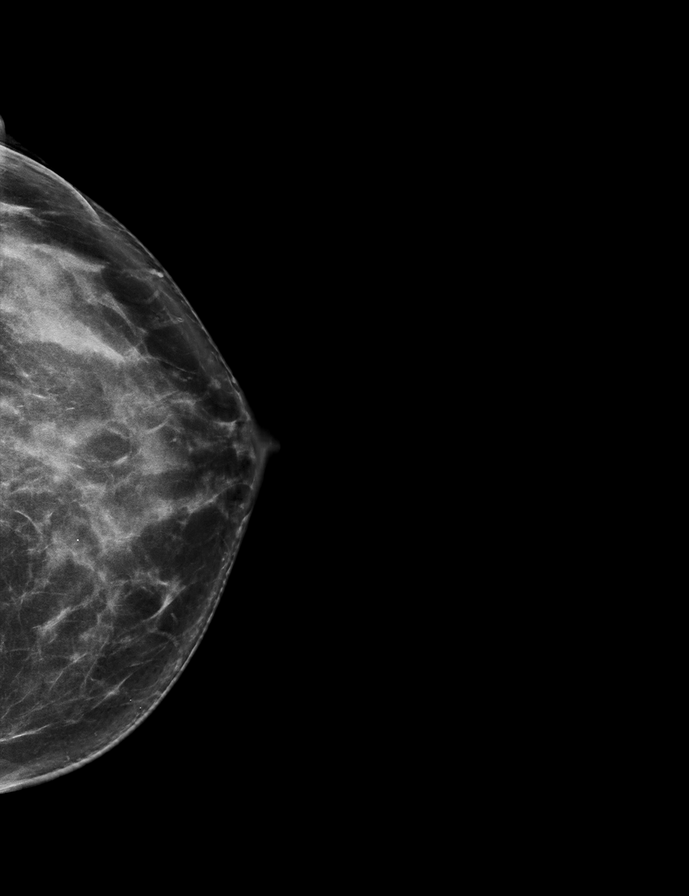

[L MLO tomo · 2 of 69 frames shown]
[frame 23/69]
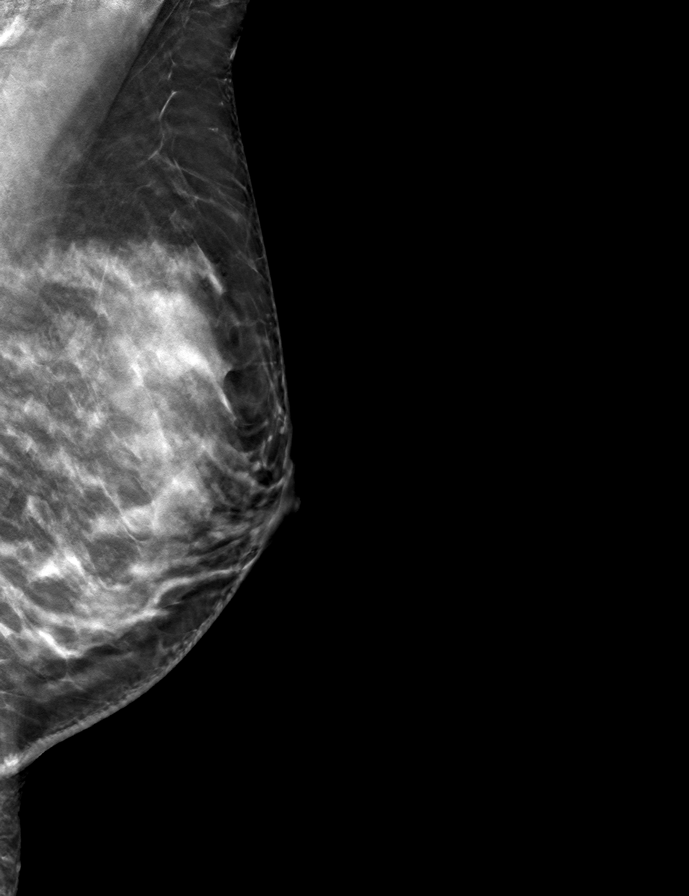
[frame 35/69]
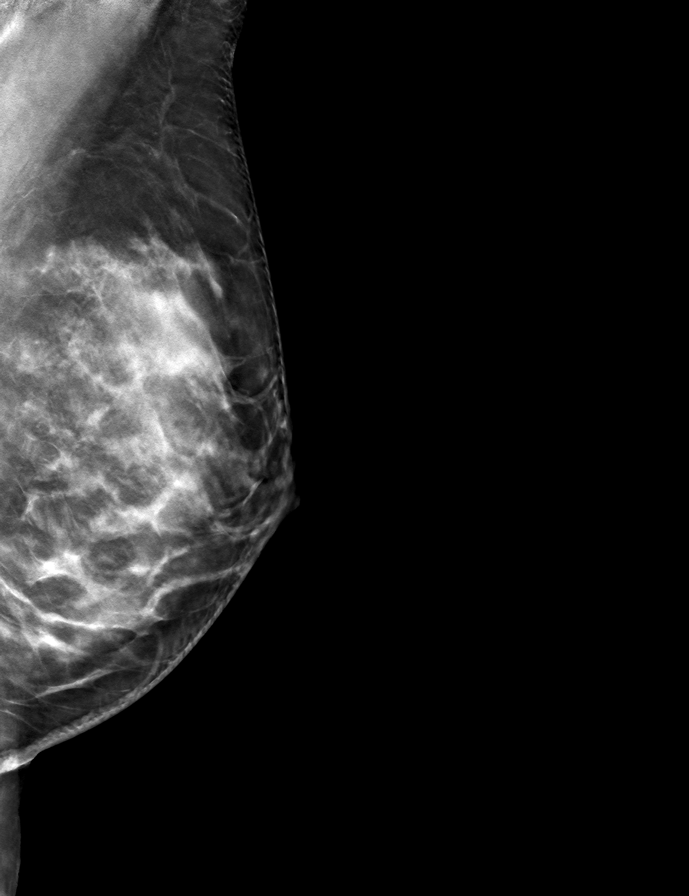

[L CC tomo · tomo slice 36/71.0]
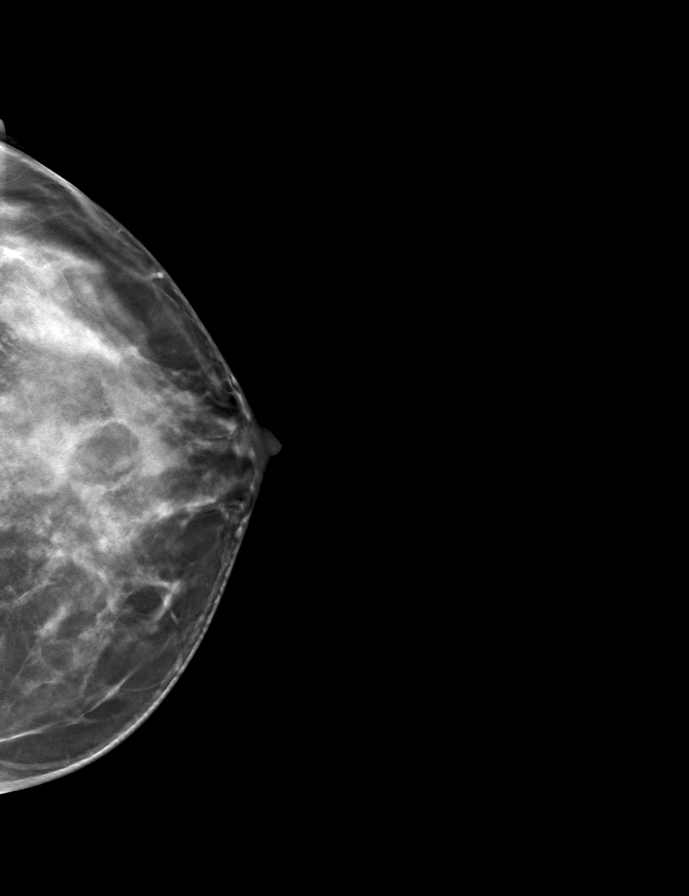

[R MLO tomo · tomo slice 36/71.0]
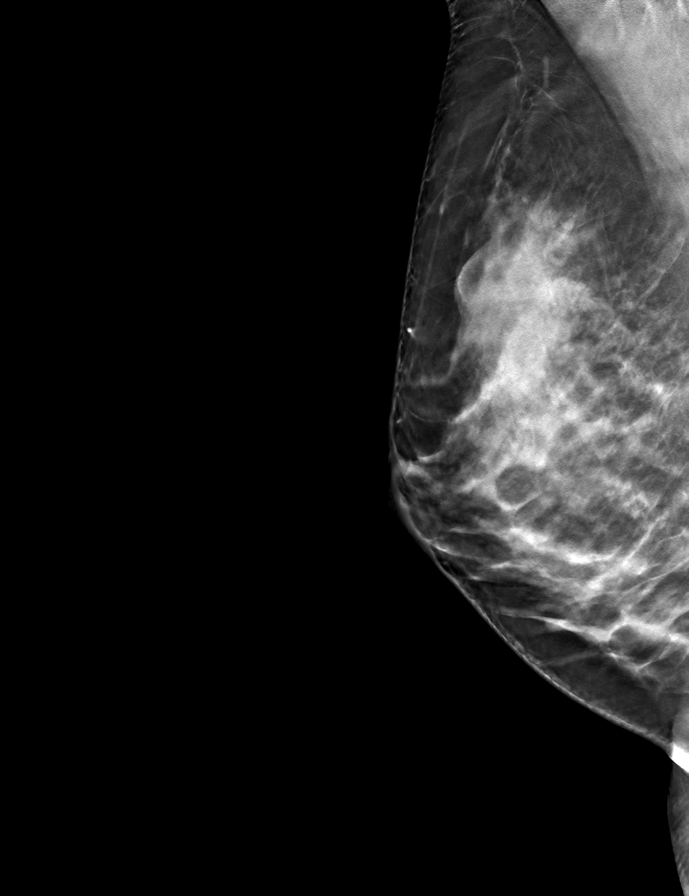

[R CC tomo · tomo slice 36/71.0]
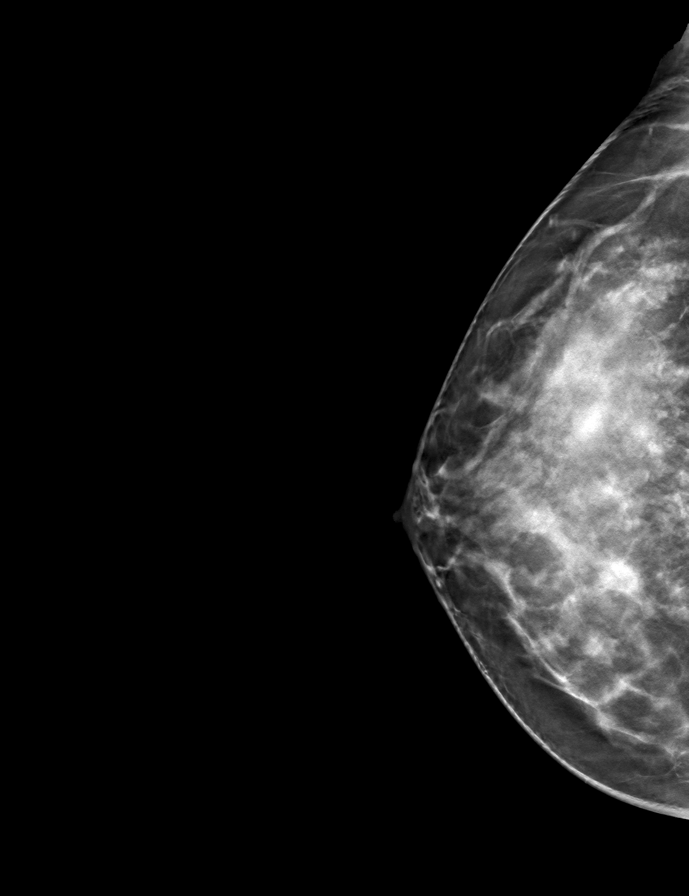

[9 of 24 positions shown; findings below may reference images not displayed]

ACR Breast Density Category c: The breast tissue is heterogeneously
dense, which may obscure small masses.
FINDINGS: There are no findings suspicious for malignancy.
IMPRESSION: No mammographic evidence of malignancy. A result letter of this
screening mammogram will be mailed directly to the patient.

RECOMMENDATION:
Screening mammogram in one year. (Code:Q3-W-BC3)

BI-RADS CATEGORY  1: Negative.

## 2023-09-11 DIAGNOSIS — N182 Chronic kidney disease, stage 2 (mild): Secondary | ICD-10-CM | POA: Diagnosis not present

## 2023-09-11 DIAGNOSIS — R5383 Other fatigue: Secondary | ICD-10-CM | POA: Diagnosis not present

## 2023-09-11 DIAGNOSIS — Z0000 Encounter for general adult medical examination without abnormal findings: Secondary | ICD-10-CM | POA: Diagnosis not present

## 2023-09-16 DIAGNOSIS — M3500 Sicca syndrome, unspecified: Secondary | ICD-10-CM | POA: Diagnosis not present

## 2023-09-16 DIAGNOSIS — Z8669 Personal history of other diseases of the nervous system and sense organs: Secondary | ICD-10-CM | POA: Diagnosis not present

## 2023-09-16 DIAGNOSIS — M797 Fibromyalgia: Secondary | ICD-10-CM | POA: Diagnosis not present

## 2023-09-16 DIAGNOSIS — I7300 Raynaud's syndrome without gangrene: Secondary | ICD-10-CM | POA: Diagnosis not present

## 2023-09-16 DIAGNOSIS — Z0000 Encounter for general adult medical examination without abnormal findings: Secondary | ICD-10-CM | POA: Diagnosis not present

## 2023-09-16 DIAGNOSIS — Z23 Encounter for immunization: Secondary | ICD-10-CM | POA: Diagnosis not present

## 2023-09-25 DIAGNOSIS — R748 Abnormal levels of other serum enzymes: Secondary | ICD-10-CM | POA: Diagnosis not present

## 2023-09-25 DIAGNOSIS — E782 Mixed hyperlipidemia: Secondary | ICD-10-CM | POA: Diagnosis not present

## 2023-10-02 DIAGNOSIS — M3500 Sicca syndrome, unspecified: Secondary | ICD-10-CM | POA: Diagnosis not present

## 2023-10-02 DIAGNOSIS — M2550 Pain in unspecified joint: Secondary | ICD-10-CM | POA: Diagnosis not present

## 2023-10-02 DIAGNOSIS — M797 Fibromyalgia: Secondary | ICD-10-CM | POA: Diagnosis not present

## 2023-10-02 DIAGNOSIS — R748 Abnormal levels of other serum enzymes: Secondary | ICD-10-CM | POA: Diagnosis not present

## 2023-10-02 DIAGNOSIS — H04129 Dry eye syndrome of unspecified lacrimal gland: Secondary | ICD-10-CM | POA: Diagnosis not present

## 2023-10-03 ENCOUNTER — Other Ambulatory Visit: Payer: Self-pay

## 2023-10-03 DIAGNOSIS — L819 Disorder of pigmentation, unspecified: Secondary | ICD-10-CM

## 2023-10-09 ENCOUNTER — Ambulatory Visit
Admission: RE | Admit: 2023-10-09 | Discharge: 2023-10-09 | Disposition: A | Discharge status: Home / Self Care | Payer: Self-pay | Source: Ambulatory Visit

## 2023-10-09 DIAGNOSIS — L819 Disorder of pigmentation, unspecified: Secondary | ICD-10-CM

## 2023-11-02 DIAGNOSIS — J029 Acute pharyngitis, unspecified: Secondary | ICD-10-CM | POA: Diagnosis not present

## 2023-11-02 DIAGNOSIS — H938X3 Other specified disorders of ear, bilateral: Secondary | ICD-10-CM | POA: Diagnosis not present

## 2023-11-02 DIAGNOSIS — R051 Acute cough: Secondary | ICD-10-CM | POA: Diagnosis not present

## 2023-11-02 DIAGNOSIS — R0981 Nasal congestion: Secondary | ICD-10-CM | POA: Diagnosis not present

## 2023-11-02 DIAGNOSIS — U071 COVID-19: Secondary | ICD-10-CM | POA: Diagnosis not present

## 2023-11-24 ENCOUNTER — Other Ambulatory Visit: Payer: Self-pay | Admitting: Medical Genetics

## 2023-12-11 DIAGNOSIS — E782 Mixed hyperlipidemia: Secondary | ICD-10-CM | POA: Diagnosis not present

## 2023-12-11 DIAGNOSIS — R748 Abnormal levels of other serum enzymes: Secondary | ICD-10-CM | POA: Diagnosis not present

## 2023-12-18 DIAGNOSIS — M797 Fibromyalgia: Secondary | ICD-10-CM | POA: Diagnosis not present

## 2023-12-18 DIAGNOSIS — Z8669 Personal history of other diseases of the nervous system and sense organs: Secondary | ICD-10-CM | POA: Diagnosis not present

## 2023-12-18 DIAGNOSIS — E782 Mixed hyperlipidemia: Secondary | ICD-10-CM | POA: Diagnosis not present

## 2023-12-18 DIAGNOSIS — Z23 Encounter for immunization: Secondary | ICD-10-CM | POA: Diagnosis not present

## 2023-12-18 DIAGNOSIS — R748 Abnormal levels of other serum enzymes: Secondary | ICD-10-CM | POA: Diagnosis not present

## 2023-12-25 ENCOUNTER — Other Ambulatory Visit (HOSPITAL_COMMUNITY)
Admission: RE | Admit: 2023-12-25 | Discharge: 2023-12-25 | Disposition: A | Discharge status: Home / Self Care | Payer: Self-pay | Source: Ambulatory Visit | Attending: Medical Genetics | Admitting: Medical Genetics

## 2024-01-05 LAB — GENECONNECT MOLECULAR SCREEN: Genetic Analysis Overall Interpretation: NEGATIVE

## 2024-02-28 ENCOUNTER — Other Ambulatory Visit (HOSPITAL_COMMUNITY): Payer: Self-pay

## 2024-03-25 ENCOUNTER — Encounter: Payer: Self-pay | Admitting: Adult Health

## 2024-03-25 DIAGNOSIS — G43009 Migraine without aura, not intractable, without status migrainosus: Secondary | ICD-10-CM

## 2024-03-25 MED ORDER — TOPIRAMATE 50 MG PO TABS: 75.0000 mg | ORAL_TABLET | Freq: Two times a day (BID) | ORAL | 3 refills | Status: AC

## 2024-04-02 DIAGNOSIS — M3500 Sicca syndrome, unspecified: Secondary | ICD-10-CM | POA: Diagnosis not present

## 2024-04-02 DIAGNOSIS — H04129 Dry eye syndrome of unspecified lacrimal gland: Secondary | ICD-10-CM | POA: Diagnosis not present

## 2024-04-02 DIAGNOSIS — I7300 Raynaud's syndrome without gangrene: Secondary | ICD-10-CM | POA: Diagnosis not present

## 2024-04-02 DIAGNOSIS — M2550 Pain in unspecified joint: Secondary | ICD-10-CM | POA: Diagnosis not present

## 2024-06-02 ENCOUNTER — Telehealth: Admitting: Adult Health
